# Patient Record
Sex: Female | Born: 1935 | Race: White | Hispanic: No | Marital: Married | Smoking: Never smoker
Health system: Southern US, Community
[De-identification: ages and names within clinical notes are randomized; demographics above are authoritative.]

## PROBLEM LIST (undated history)

## (undated) DIAGNOSIS — J449 Chronic obstructive pulmonary disease, unspecified: Secondary | ICD-10-CM

## (undated) DIAGNOSIS — I10 Essential (primary) hypertension: Secondary | ICD-10-CM

## (undated) DIAGNOSIS — K5792 Diverticulitis of intestine, part unspecified, without perforation or abscess without bleeding: Secondary | ICD-10-CM

## (undated) DIAGNOSIS — K219 Gastro-esophageal reflux disease without esophagitis: Secondary | ICD-10-CM

## (undated) DIAGNOSIS — Z87442 Personal history of urinary calculi: Secondary | ICD-10-CM

## (undated) DIAGNOSIS — I639 Cerebral infarction, unspecified: Secondary | ICD-10-CM

## (undated) DIAGNOSIS — G459 Transient cerebral ischemic attack, unspecified: Secondary | ICD-10-CM

## (undated) DIAGNOSIS — J189 Pneumonia, unspecified organism: Secondary | ICD-10-CM

## (undated) DIAGNOSIS — Z8639 Personal history of other endocrine, nutritional and metabolic disease: Secondary | ICD-10-CM

## (undated) DIAGNOSIS — Z8719 Personal history of other diseases of the digestive system: Secondary | ICD-10-CM

## (undated) HISTORY — PX: WISDOM TOOTH EXTRACTION: SHX21

## (undated) HISTORY — PX: ABDOMINAL HYSTERECTOMY: SHX81

## (undated) HISTORY — PX: CHOLECYSTECTOMY: SHX55

## (undated) HISTORY — PX: COLONOSCOPY: SHX174

## (undated) HISTORY — DX: Diverticulitis of intestine, part unspecified, without perforation or abscess without bleeding: K57.92

## (undated) HISTORY — PX: TONSILLECTOMY: SUR1361

## (undated) HISTORY — PX: UPPER GI ENDOSCOPY: SHX6162

---

## 1998-12-06 ENCOUNTER — Other Ambulatory Visit: Admission: RE | Admit: 1998-12-06 | Discharge: 1998-12-06 | Payer: Self-pay | Admitting: *Deleted

## 2001-08-14 ENCOUNTER — Encounter: Payer: Self-pay | Admitting: Gastroenterology

## 2001-09-14 ENCOUNTER — Encounter: Payer: Self-pay | Admitting: Gastroenterology

## 2002-10-19 ENCOUNTER — Encounter: Payer: Self-pay | Admitting: Gastroenterology

## 2002-10-20 ENCOUNTER — Encounter: Payer: Self-pay | Admitting: Gastroenterology

## 2002-11-22 ENCOUNTER — Encounter: Payer: Self-pay | Admitting: Gastroenterology

## 2004-03-02 ENCOUNTER — Encounter: Payer: Self-pay | Admitting: Gastroenterology

## 2007-02-12 ENCOUNTER — Encounter: Payer: Self-pay | Admitting: Gastroenterology

## 2007-02-24 ENCOUNTER — Encounter: Payer: Self-pay | Admitting: Gastroenterology

## 2007-06-09 ENCOUNTER — Ambulatory Visit: Payer: Self-pay | Admitting: Internal Medicine

## 2007-07-03 ENCOUNTER — Ambulatory Visit: Payer: Self-pay | Admitting: Internal Medicine

## 2007-07-03 ENCOUNTER — Ambulatory Visit: Payer: Self-pay | Admitting: Cardiology

## 2007-07-21 ENCOUNTER — Ambulatory Visit: Payer: Self-pay | Admitting: Internal Medicine

## 2007-08-10 ENCOUNTER — Ambulatory Visit: Payer: Self-pay | Admitting: Internal Medicine

## 2007-08-11 ENCOUNTER — Ambulatory Visit: Payer: Self-pay | Admitting: Cardiology

## 2008-02-29 DIAGNOSIS — J018 Other acute sinusitis: Secondary | ICD-10-CM

## 2008-02-29 DIAGNOSIS — R05 Cough: Secondary | ICD-10-CM

## 2008-03-01 ENCOUNTER — Ambulatory Visit: Payer: Self-pay | Admitting: Pulmonary Disease

## 2008-03-01 ENCOUNTER — Ambulatory Visit: Payer: Self-pay | Admitting: Internal Medicine

## 2008-03-01 DIAGNOSIS — J189 Pneumonia, unspecified organism: Secondary | ICD-10-CM

## 2008-03-16 ENCOUNTER — Ambulatory Visit: Payer: Self-pay | Admitting: Internal Medicine

## 2008-03-25 ENCOUNTER — Ambulatory Visit: Payer: Self-pay | Admitting: Pulmonary Disease

## 2008-03-25 DIAGNOSIS — R0602 Shortness of breath: Secondary | ICD-10-CM | POA: Insufficient documentation

## 2008-03-25 LAB — CONVERTED CEMR LAB
CO2: 30 meq/L (ref 19–32)
Calcium: 9.9 mg/dL (ref 8.4–10.5)
Chloride: 105 meq/L (ref 96–112)
GFR calc Af Amer: 105 mL/min
GFR calc non Af Amer: 87 mL/min
Glucose, Bld: 96 mg/dL (ref 70–99)
Sodium: 142 meq/L (ref 135–145)

## 2008-03-28 ENCOUNTER — Ambulatory Visit: Payer: Self-pay | Admitting: Cardiology

## 2008-05-04 ENCOUNTER — Ambulatory Visit: Payer: Self-pay | Admitting: Internal Medicine

## 2008-05-05 LAB — CONVERTED CEMR LAB
Basophils Relative: 0.1 % (ref 0.0–1.0)
Eosinophils Relative: 1.6 % (ref 0.0–5.0)
HCT: 41 % (ref 36.0–46.0)
Hemoglobin: 14 g/dL (ref 12.0–15.0)
Monocytes Absolute: 0.5 10*3/uL (ref 0.1–1.0)
Monocytes Relative: 9 % (ref 3.0–12.0)
Neutro Abs: 3 10*3/uL (ref 1.4–7.7)
RBC: 4.92 M/uL (ref 3.87–5.11)
RDW: 14.4 % (ref 11.5–14.6)
Sed Rate: 10 mm/hr (ref 0–22)

## 2008-10-01 IMAGING — CR DG CHEST 2V
2 series · 2 of 2 positions shown · non-contrast
Comparison: 03/01/2008.

CLINICAL DATA: Chronic cough and dyspnea.  Increased cough for 1
week.

CHEST - 2 VIEW

[view not recorded (1 of 2)]
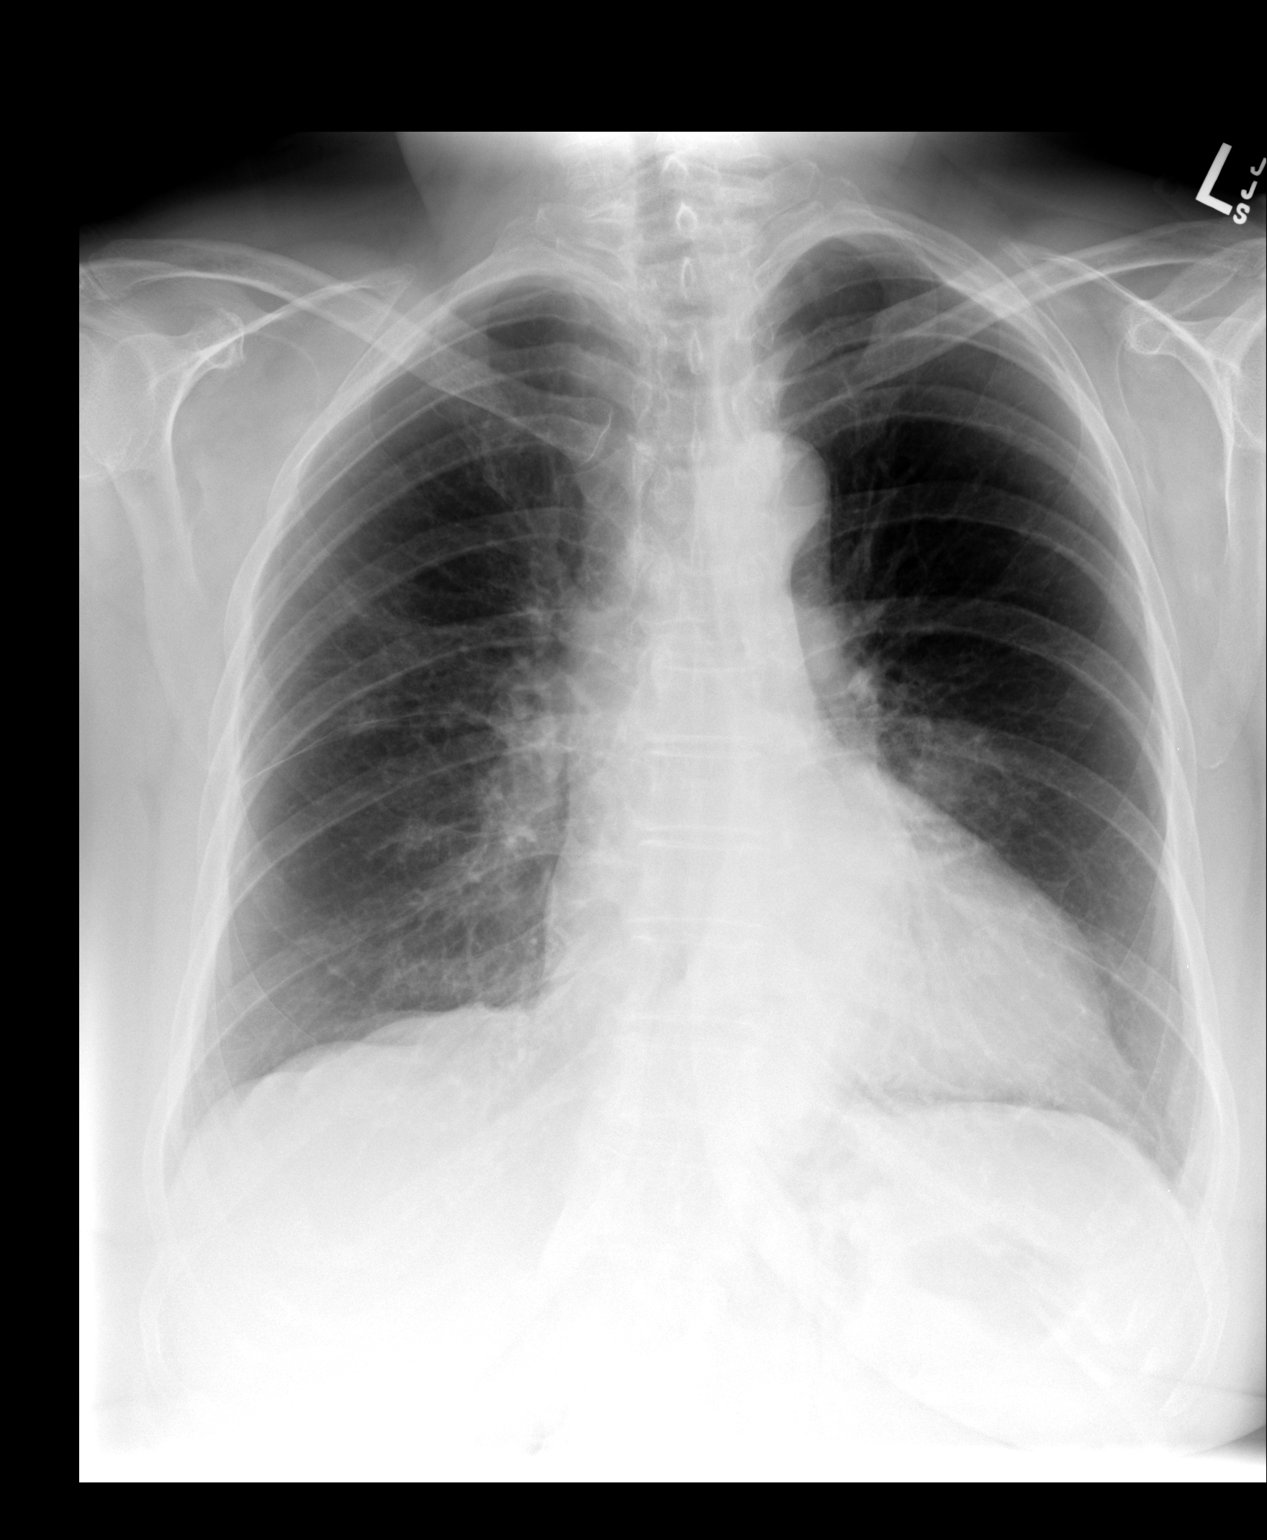

[view not recorded (2 of 2)]
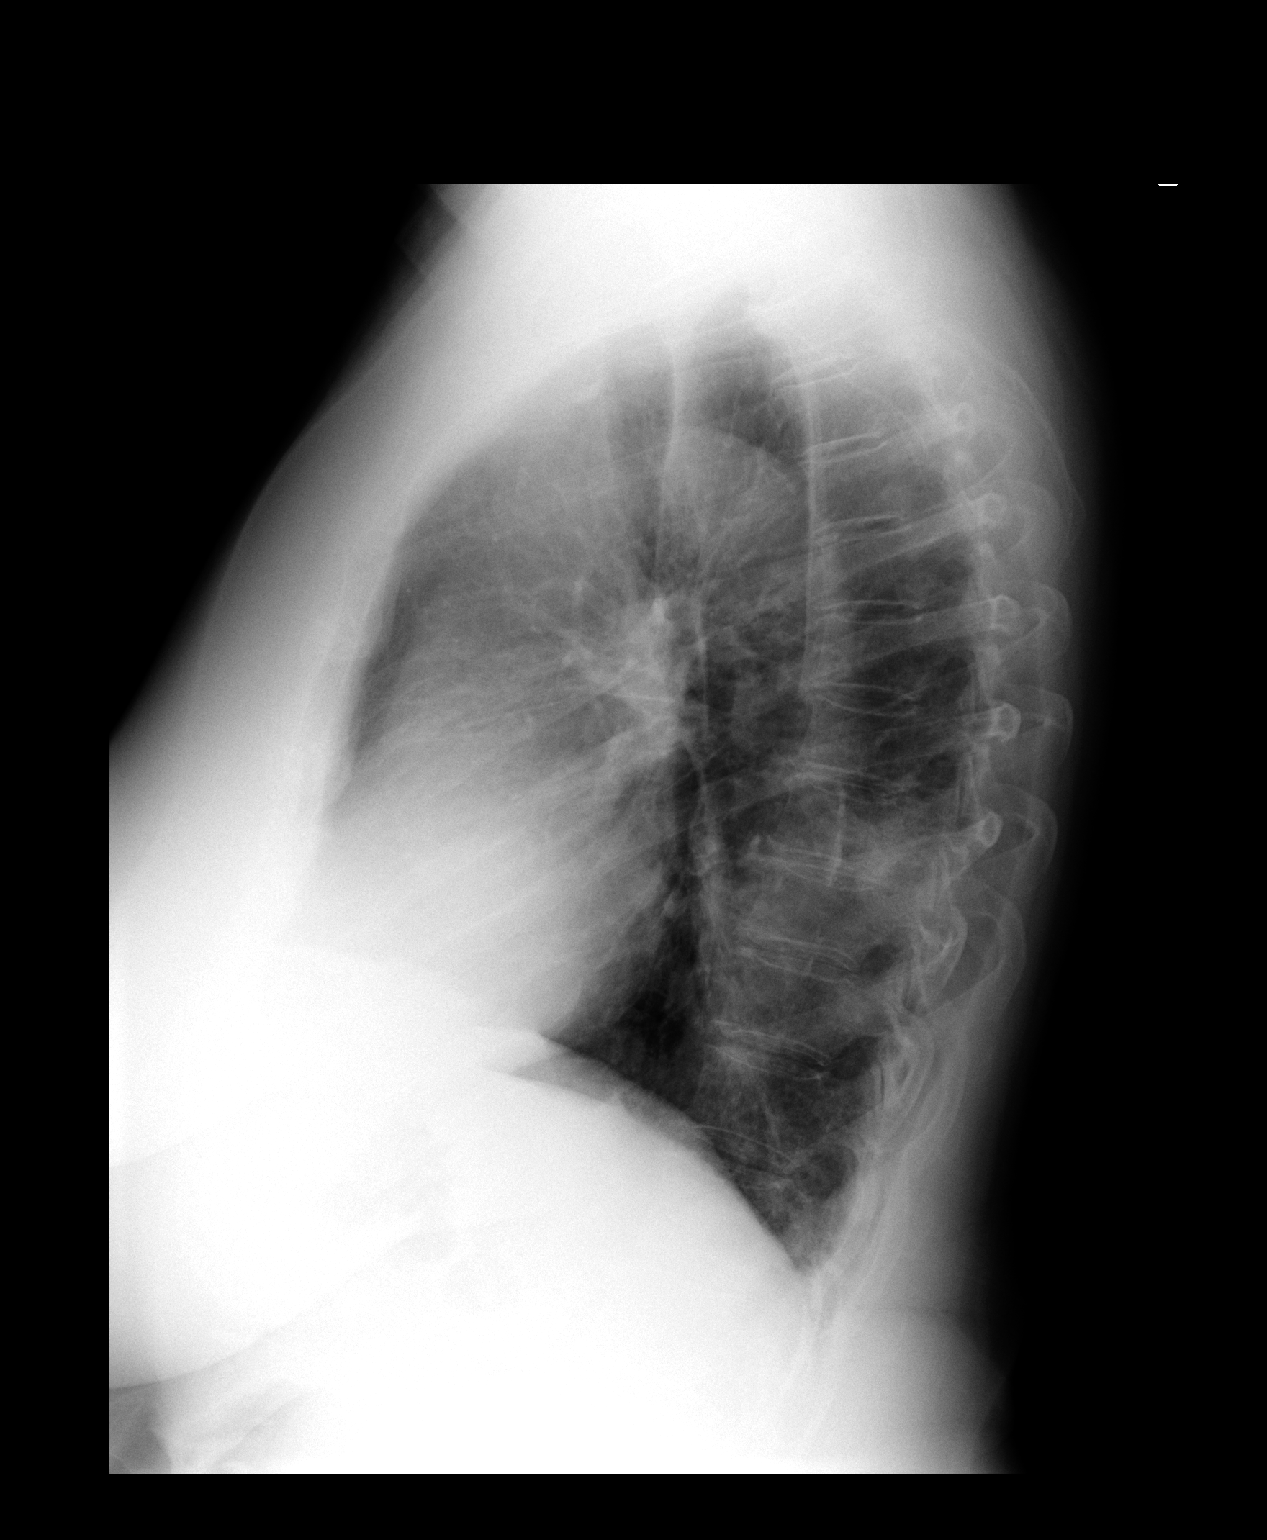

[2 of 2 positions shown; findings below may reference images not displayed]

FINDINGS: There is stable mild cardiac enlargement.  Aortic
tortuosity appears stable.  There is new air space disease in the
superior segment of the left lower lobe.  There is also a possible
patchy opacity in the right upper lobe, best seen on the frontal
examination.  No pleural effusion is seen.
IMPRESSION: Left lower lobe, and possible right upper lobe, pneumonia.
Radiographic follow up to document clearing is recommended.

I discussed the findings by telephone with Dr Hsibullah on 03/25/2008
at 2258 hours.

## 2008-10-28 ENCOUNTER — Ambulatory Visit: Payer: Self-pay | Admitting: Internal Medicine

## 2008-10-28 DIAGNOSIS — K219 Gastro-esophageal reflux disease without esophagitis: Secondary | ICD-10-CM | POA: Insufficient documentation

## 2008-11-23 ENCOUNTER — Telehealth (INDEPENDENT_AMBULATORY_CARE_PROVIDER_SITE_OTHER): Payer: Self-pay | Admitting: *Deleted

## 2008-12-16 DIAGNOSIS — E785 Hyperlipidemia, unspecified: Secondary | ICD-10-CM

## 2008-12-16 DIAGNOSIS — K5730 Diverticulosis of large intestine without perforation or abscess without bleeding: Secondary | ICD-10-CM | POA: Insufficient documentation

## 2008-12-16 DIAGNOSIS — M810 Age-related osteoporosis without current pathological fracture: Secondary | ICD-10-CM | POA: Insufficient documentation

## 2008-12-16 DIAGNOSIS — F411 Generalized anxiety disorder: Secondary | ICD-10-CM | POA: Insufficient documentation

## 2008-12-16 DIAGNOSIS — I10 Essential (primary) hypertension: Secondary | ICD-10-CM | POA: Insufficient documentation

## 2008-12-21 ENCOUNTER — Ambulatory Visit: Payer: Self-pay | Admitting: Gastroenterology

## 2008-12-21 DIAGNOSIS — R198 Other specified symptoms and signs involving the digestive system and abdomen: Secondary | ICD-10-CM | POA: Insufficient documentation

## 2008-12-28 ENCOUNTER — Ambulatory Visit: Payer: Self-pay | Admitting: Gastroenterology

## 2008-12-28 ENCOUNTER — Telehealth: Payer: Self-pay | Admitting: Gastroenterology

## 2008-12-28 ENCOUNTER — Ambulatory Visit (HOSPITAL_COMMUNITY): Admission: RE | Admit: 2008-12-28 | Discharge: 2008-12-28 | Payer: Self-pay | Admitting: Gastroenterology

## 2008-12-30 ENCOUNTER — Encounter: Payer: Self-pay | Admitting: Gastroenterology

## 2008-12-30 ENCOUNTER — Telehealth: Payer: Self-pay | Admitting: Gastroenterology

## 2009-01-04 ENCOUNTER — Telehealth: Payer: Self-pay | Admitting: Gastroenterology

## 2009-01-04 ENCOUNTER — Encounter: Payer: Self-pay | Admitting: Gastroenterology

## 2009-01-04 ENCOUNTER — Ambulatory Visit (HOSPITAL_COMMUNITY): Admission: RE | Admit: 2009-01-04 | Discharge: 2009-01-04 | Payer: Self-pay | Admitting: Gastroenterology

## 2009-01-04 ENCOUNTER — Ambulatory Visit: Payer: Self-pay | Admitting: Gastroenterology

## 2009-01-04 DIAGNOSIS — R197 Diarrhea, unspecified: Secondary | ICD-10-CM

## 2009-01-04 LAB — CONVERTED CEMR LAB
AST: 16 units/L (ref 0–37)
Albumin: 3.8 g/dL (ref 3.5–5.2)
Alkaline Phosphatase: 81 units/L (ref 39–117)
CRP, High Sensitivity: 15 — ABNORMAL HIGH (ref 0.00–5.00)
Eosinophils Absolute: 0 10*3/uL (ref 0.0–0.7)
HCT: 43.5 % (ref 36.0–46.0)
MCHC: 35 g/dL (ref 30.0–36.0)
MCV: 82.8 fL (ref 78.0–100.0)
Monocytes Absolute: 0.6 10*3/uL (ref 0.1–1.0)
Neutro Abs: 4.3 10*3/uL (ref 1.4–7.7)
Platelets: 342 10*3/uL (ref 150–400)
Potassium: 3.5 meq/L (ref 3.5–5.1)
RDW: 13.4 % (ref 11.5–14.6)
Sodium: 141 meq/L (ref 135–145)
Total Bilirubin: 0.7 mg/dL (ref 0.3–1.2)
Total Protein: 7.3 g/dL (ref 6.0–8.3)

## 2009-01-06 ENCOUNTER — Telehealth: Payer: Self-pay | Admitting: Gastroenterology

## 2009-01-06 ENCOUNTER — Encounter: Payer: Self-pay | Admitting: Gastroenterology

## 2009-01-12 ENCOUNTER — Ambulatory Visit: Payer: Self-pay | Admitting: Gastroenterology

## 2009-01-12 DIAGNOSIS — K5289 Other specified noninfective gastroenteritis and colitis: Secondary | ICD-10-CM

## 2009-01-16 ENCOUNTER — Telehealth: Payer: Self-pay | Admitting: Gastroenterology

## 2009-01-17 ENCOUNTER — Telehealth: Payer: Self-pay | Admitting: Gastroenterology

## 2009-02-15 ENCOUNTER — Ambulatory Visit: Payer: Self-pay | Admitting: Gastroenterology

## 2009-02-28 ENCOUNTER — Telehealth: Payer: Self-pay | Admitting: Gastroenterology

## 2009-03-03 ENCOUNTER — Ambulatory Visit: Payer: Self-pay | Admitting: Gastroenterology

## 2009-03-03 DIAGNOSIS — R197 Diarrhea, unspecified: Secondary | ICD-10-CM | POA: Insufficient documentation

## 2009-03-03 DIAGNOSIS — K625 Hemorrhage of anus and rectum: Secondary | ICD-10-CM

## 2009-03-03 DIAGNOSIS — R1084 Generalized abdominal pain: Secondary | ICD-10-CM

## 2009-03-03 LAB — CONVERTED CEMR LAB
Basophils Absolute: 0 10*3/uL (ref 0.0–0.1)
Basophils Relative: 0.5 % (ref 0.0–3.0)
HCT: 37.4 % (ref 36.0–46.0)
Hemoglobin: 12.5 g/dL (ref 12.0–15.0)
Lymphocytes Relative: 39.8 % (ref 12.0–46.0)
Lymphs Abs: 3.1 10*3/uL (ref 0.7–4.0)
MCHC: 33.4 g/dL (ref 30.0–36.0)
Monocytes Relative: 7.3 % (ref 3.0–12.0)
Neutro Abs: 3.9 10*3/uL (ref 1.4–7.7)
RBC: 4.46 M/uL (ref 3.87–5.11)
RDW: 14.2 % (ref 11.5–14.6)

## 2009-03-06 ENCOUNTER — Encounter: Payer: Self-pay | Admitting: Physician Assistant

## 2009-04-19 ENCOUNTER — Telehealth (INDEPENDENT_AMBULATORY_CARE_PROVIDER_SITE_OTHER): Payer: Self-pay | Admitting: *Deleted

## 2009-05-18 ENCOUNTER — Telehealth: Payer: Self-pay | Admitting: Gastroenterology

## 2010-05-28 ENCOUNTER — Telehealth: Payer: Self-pay | Admitting: Gastroenterology

## 2010-06-26 ENCOUNTER — Telehealth: Payer: Self-pay | Admitting: Gastroenterology

## 2010-06-27 ENCOUNTER — Encounter: Payer: Self-pay | Admitting: Gastroenterology

## 2010-06-28 ENCOUNTER — Encounter: Payer: Self-pay | Admitting: Gastroenterology

## 2010-12-09 ENCOUNTER — Encounter: Payer: Self-pay | Admitting: Gastroenterology

## 2010-12-20 NOTE — Progress Notes (Signed)
Summary: Medication  Phone Note Call from Patient Call back at Home Phone [phone]   Caller: Patient Call For: Dr. Arlyce Dice Reason for Call: Talk to Nurse Summary of Call: Ins. will not cover KAPIDEX....is there an alternative or can Dr. Arlyce Dice write a letter to Ins. Initial call taken by: Karna Christmas,  May 28, 2010 11:30 AM  Follow-up for Phone Call        Pt states that she can not afford dexilant, It cost her 140$ for her monthly prescription. She stated this med is the only PPI that helps her. Told her I could give her samples until I could get the med approved through her insurance. Follow-up by: Merri Ray CMA Duncan Dull),  May 28, 2010 1:02 PM  Additional Follow-up for Phone Call Additional follow up Details #1::        Resent med, Have not heard back from pharmacy or pt. Additional Follow-up by: Merri Ray CMA (AAMA),  May 31, 2010 1:56 PM    New/Updated Medications: DEXILANT 60 MG CPDR (DEXLANSOPRAZOLE) 1 by mouth once daily Prescriptions: DEXILANT 60 MG CPDR (DEXLANSOPRAZOLE) 1 by mouth once daily  #30 x 11   Entered by:   Merri Ray CMA (AAMA)   Authorized by:   Louis Meckel MD   Signed by:   Merri Ray CMA (AAMA) on 05/28/2010   Method used:   Electronically to        [facility] (retail)       [address]       [place], [place]  [address]       Ph: [phone] or [phone]       Fax: [phone]   RxID:   [id number]

## 2010-12-20 NOTE — Medication Information (Signed)
Summary: Dexilant Approved/[employer]  Dexilant Approved/[employer]   Imported By: Sherian Rein 07/02/2010 12:13:35  _____________________________________________________________________  External Attachment:    Type:   Image     Comment:   External Document

## 2010-12-20 NOTE — Progress Notes (Signed)
Summary: Medication Refill  Phone Note Call from Patient Call back at Home Phone [phone] Call back at [phone]   Caller: Patient Call For: Dr. Arlyce Dice Reason for Call: Talk to Doctor Details for Reason: Medication Refill Summary of Call: Pt. needs Kapidex. Has 2 pills left. States our office needs to call and verify that she "needs" them so she can get them for a lesser price. Call pt. if you have any questions. Initial call taken by: Schuyler Amor,  June 26, 2010 9:40 AM  Follow-up for Phone Call        Called pt to inform that new prescription of Dexilant was faxed in on 7/14. She stated the insurance co had to be contacted. Explained to her that we have to have a request fornm from pharmacy with prescrition information on it to be able to contact her insurance company. Offered pt samples today.  Follow-up by: Merri Ray CMA Duncan Dull),  June 26, 2010 10:18 AM     Appended Document: Medication Refill Pt approved for Dexilant  06/28/2010-06/29/2011  approval sent down to be scanned

## 2010-12-20 NOTE — Medication Information (Signed)
Summary: Prior [employer] for Dexilant/[employer]  Prior [employer] for Dexilant/[employer]   Imported By: Sherian Rein 07/16/2010 08:44:18  _____________________________________________________________________  External Attachment:    Type:   Image     Comment:   External Document

## 2011-04-02 NOTE — Assessment & Plan Note (Signed)
[facility]                             PULMONARY OFFICE NOTE   Melissa, Herring                        MRN:          [id number]  DATE:08/10/2007                            DOB:          Apr 27, 1931    HISTORY:  This is a 77 year old white female returned all smiles with  a diagnosis of chronic cough felt to be secondary to sinus disease with  or without reflux.  She underwent a sinus CT scan dated August 15 that  showed pansinusitis, and seemed 100% better while on a prolonged  course of antibiotics, but now is beginning to have slight increase in  throat clearing, having been off the antibiotics for over a week, while  still maintaining on high doses of acid suppression in the form of  Prilosec plus nightly Zantac plus metoclopramide.   PHYSICAL EXAMINATION:  She does, indeed, clear her throat frequently.  She is afebrile with normal vital signs.  HEENT:  Unremarkable.  Oropharynx clear.  Lung fields are perfectly clear bilaterally to auscultation and  percussion.  HEART:  Regular rate and rhythm without murmurs, gallops, or rubs.  ABDOMEN:  Soft, benign.  EXTREMITIES:  Warm without calf tenderness, cyanosis, clubbing, or  edema.   Pulmonary function tests were reviewed with the patient from August 15,  and showed no evidence of significant airflow obstruction.   IMPRESSION:  Chronic cough is upper airway in nature and probably is  related to sinusitis with secondary gastroesophageal reflux disease from  coughing.  This is why she responds, to some extent, for treatment  directed to either rhinitis or reflux, but never completely better  unless both are treated aggressively.   Unfortunately, given the severity of her sinus disease on CT scan, it is  likely that even a prolonged course of antibiotics did not correct the  problem.  Nonetheless, I did try to educate her on optimal management of  chronic rhinitis, including introducing her to  the use of Nasonex today,  and asking her to repeat a full coronal CT scan to see if ENT referral  will be necessary for long term followup.   Until we sort out, however, the causes of cough, I have asked her to  continue on her present regimen directed at reflux, and have added the  Nasonex for rhinitis today.  Will await the results of a CT scan before  considering referral to ENT versus back here for possible allergy  testing for sinus disease, depending on the results of the CT scan that  has been ordered for September 23.    [name]. [name], MD, [facility]  Electronically Signed   MBW/MedQ  DD: 08/10/2007  DT: 08/11/2007  Job #: [id number]   cc:   Feliciana Rossetti, MD

## 2011-04-02 NOTE — Assessment & Plan Note (Signed)
[facility]                             PULMONARY OFFICE NOTE   RAMATOULAYE, PACK                        MRN:          [id number]  DATE:07/21/2007                            DOB:          10-08-1931    HISTORY AND PHYSICAL:  Patient is a 77 year old white female patient of  Dr. Thurston Hole who has a history of a progressively worsening cough and  dyspnea, dating back to January, 2008.  She was recently seen two weeks  ago and underwent a CT of the sinuses, which showed extensive sinusitis  throughout the paranasal sinuses.  The patient was given a total 15 days  of Avelox.  Patient was also felt to have upper airway cough syndrome,  possibly secondary to gastroesophageal reflux.  The patient was given  aggressive reflux preventative regimen with Nexium or Prilosec twice  daily along with Reglan 10 mg four times a day and Zantac 2 at bedtime.  She was recommended to use Mucinex DM twice daily, tramadol for  breakthrough coughing, and a prednisone taper.   Patient returns back today reporting that she has substantially improved  and feels that she is 90% better with only an occasional cough.  Patient  has bought all of her medications in today for review.  Patient does  have four additional days of Avelox left; however, upon calculation,  should have finished her prescription on July 18, 2007.   Patient also did not bring her Prilosec, Mucinex DM, or tramadol;  however, she assures me that she has them at home and has been using  them.   Patient denies any purulent sputum, fever, chest pain, orthopnea, PND,  or leg swelling.   PAST MEDICAL HISTORY:  Reviewed.   CURRENT MEDICATIONS:  Reviewed.   PHYSICAL EXAMINATION:  Patient is a pleasant female in no acute  distress.  She is afebrile.  Blood pressure 164/84, O2 saturation is 95% on room  air.  HEENT:  Nasal mucosa with some mild erythema.  Nontender sinuses.  The  posterior pharynx is clear.  NECK:  Supple without cervical adenopathy.  No JVD.  LUNGS:  Lung sounds are clear to auscultation bilaterally without any  wheezing or crackles.  CARDIAC:  Regular rate.  ABDOMEN:  Soft and nontender.  EXTREMITIES:  Warm without any edema.   IMPRESSION/PLAN:  1. Acute sinusitis by CT scan on July 03, 2007.  Patient is to      finish Avelox, as recommended, for a total of 15 days.  Is to use      Mucinex DM and tramadol as needed for cough control.  Patient is to      return back here in 2-3 weeks with Dr. [name] or sooner if needed.  2. Upper airway cough syndrome, possibly secondary to gastroesophageal      reflux and/or underlying sinusitis.  Patient is to continue on her      above regimen for underlying sinus infection and continue on her      aggressive reflux preventative measures, along with Mucinex DM and      tramadol  as needed for breakthrough coughing.  Patient will return      back here in 2-3 weeks or sooner if needed.  3. Complex medication regimen:  Patient's medications are reviewed in      detail.  Patient education is provided.  Patient was given a      computerized medication calendar, which was reviewed in detail.      Rubye Oaks, NP  Electronically Signed      [name]. [name], MD, [facility]  Electronically Signed   TP/MedQ  DD: 07/21/2007  DT: 07/21/2007  Job #: [id number]

## 2011-04-02 NOTE — Assessment & Plan Note (Signed)
[facility]                             PULMONARY OFFICE NOTE   Melissa Herring, Melissa Herring                        MRN:          [id number]  DATE:06/09/2007                            DOB:          08-Jul-1931    PULMONARY CONSULTATION:   CHIEF COMPLAINT:  Cough and dyspnea.   HISTORY:  This is a very nice 77 year old female, never smoker, who saw  Dr. Jayme Cloud in 2005 for unexplained dyspnea and was felt to have  LPR/GERD.  Apparently, she had a longstanding diagnosis of GERD prior to  developing atypical respiratory symptoms with a normal set of PFTs and  atypical chest pain leading to this diagnosis.  It was recommended that  she maintain Nexium monotherapy then, and she did well for several years  on Nexium without any chronic complaints but then in December underwent  cholecystectomy, and her breathing has been downhill ever since.  She  says she has not had a good day since January with dyspnea with minimal  activity and was acutely hospitalized in June with a left lower lobe  pneumonia, for which she was treated with an empiric course of  antibiotics and improved but no where near the baseline she had prior to  her original operation.  She denies any ongoing dysphagia, fevers,  chills, sweats, purulent sputum, orthopnea, PND, leg swelling, or chest  pain of any kind.   PAST MEDICAL HISTORY:  Significant for hypertension, for which she is  not on ACE inhibitor, and osteoporosis, for which she is on Fosamax.   ALLERGIES:  PENICILLIN causes a rash.   MEDICATIONS:  Nexium, Fosamax, Astelin, Symbicort, and Nasonex as well  as Proventil p.r.n.  She does not think her inhalers are helping.   SOCIAL HISTORY:  She has never smoked.  She was exposed growing up, her  father was a smoker.   FAMILY HISTORY:  Positive for cancer in her daughter, of the rectum.  Negative for respiratory disease.   REVIEW OF SYSTEMS:  Taken in detail on the work sheet and  essentially  negative except as outlined above.   PHYSICAL EXAMINATION:  This is an anxious white female with classic  voice fatigue and hoarseness.  She is afebrile with normal vital signs.  HEENT:  Unremarkable.  Her oropharynx is clear.  Nasal turbinates are  normal.  NECK:  Supple without cervical adenopathy or tenderness.  Trachea is  midline.  No thyromegaly.  LUNGS:  Perfectly clear bilaterally to auscultation and percussion  except for a prominent pseudowheeze.  HEART:  Regular rhythm without murmur, rub or gallop.  ABDOMEN:  Soft, benign with no palpable organomegaly.  EXTREMITIES:  Warm without calf tenderness, clubbing, cyanosis or edema.   IMPRESSION:  Classic pseudoasthma, first suggested by Dr. Jayme Cloud in  2005 and now with symptoms of refractory coughing, wheezing, and dyspnea  that did not respond to appropriate treatment directed at asthma/chronic  obstructive pulmonary disease.  Clearly, chronic obstructive pulmonary  disease does not fit because she had normal PFTs in 2005 and has only  had passive smoking exposure.  Rather  than reinvent the wheel, I am  going to go back to the original evaluation that we did in 2005 and  restart high doses of Nexium 40 mg b.i.d. before meals, reviewed a diet  with her which avoids menthol lozenges, and eliminate Fosamax from her  regimen for a trial period of up to six weeks.  If she coughs during  this time, she can use Delsym supplemented with tramadol.  If she is  wheezing or short of breath, she can use Proventil 2 puffs every 4 hours  p.r.n. but both of these should not be necessary within a couple of  weeks of suppressing acid aggressively.  The other option would be to  add on Reglan if she fails to respond to acid suppression, because many  patients have nonacid reflux.   The connection with gallbladder surgery is interesting anecdotally in  that we have had many patients who have upper airway syndromes who  exacerbate  following endotracheal tube placement.  This is probably  because the epithelial lining that protects to some extent the upper  airway from the effects of acid is denuded by the endotracheal tube.  Therefore, it may well be once the upper airway is healed that we can  rechallenge her with Fosamax or some other biphosphonate (perhaps a  monthly biphosphonate of Dr. Anders Grant choosing).   Also, paradoxically, many of the inhalers actually exacerbate this  problem.  I am going to ask her to stop all maintenance inhalers in  favor of just p.r.n. Proventil, which I spent extra time showing her how  to use.  Finally, she was concerned about a CT scan, which I have  reviewed with her from July 15 that shows marked improvement in aeration  in the left lung with minimal residual scarring, for which radiology  recommended a CT scan at three months.  Will do a baseline chest x-ray  and PFTs at six weeks and see how it compares to previous studies we  have available here in the office from her previous evaluation.   All of these instructions were reviewed with the patient in writing as  well as a reflux diet.     Melissa Herring. [name], MD, [facility]  Electronically Signed    MBW/MedQ  DD: 06/09/2007  DT: 06/10/2007  Job #: [id number]   cc:   Feliciana Rossetti, MD

## 2011-04-02 NOTE — Assessment & Plan Note (Signed)
[facility]                             PULMONARY OFFICE NOTE   Melissa Herring, Melissa Herring                        MRN:          [id number]  DATE:07/03/2007                            DOB:          [birth year]    HISTORY:  A 77 year old white female, never smoker with worsening  symptoms of dyspnea and cough dating back to January 2008, with no  improvement in dyspnea with exertion nor hacking dry cough since I saw  her on July 22, and recommended that she eliminate all of her inhalers  except for p.r.n. albuterol for shortness of breath and wheeze.  She  misunderstood a portion of the instructions and has been trying to use  the Proventil for cough whereas the cough instructions say to take  Delsym supplemented with Tramadol.  She has not yet picked up the Delsym  and has not used much of the Tramadol.  She has a constant sensation of  something draining in the back of her throat associated with chest  congestion  but actually produces no purulent sputum.  The cough is  worse on lying down at night.  However, once she settles down it does  not disturb her sleep.  She denies any pleuritic pain, fevers, chills,  sweats, orthopnea, PND, or leg swelling.   MEDICATIONS:  At this point consist only of:  1. Nasonex 2 puffs at bedtime.  2. Nexium 40 mg b.i.d.  3. Proventil p.r.n.   PHYSICAL EXAMINATION:  GENERAL:  She is an anxious white female who  clears her throat frequently during the exam and has somewhat of a  rattling upper airway type cough.  VITAL SIGNS:  She is afebrile with normal vital signs.  HEENT:  Remarkable for minimal turbinate edema.  Oropharynx is clear.  There is no evidence of excessive postnasal drainage or cobblestoning.  NECK:  Supple without cervical adenopathy or tenderness.  Trachea is  midline.  No thyromegaly.  LUNGS:  Fields reveal minimum pseudo wheeze bilaterally.  HEART:  There is a regular rhythm without murmur, gallop, or  rub.  ABDOMEN:  Soft, benign.  EXTREMITIES:  Warm without calf tenderness, cyanosis, clubbing, edema.   PFTs performed today are essentially normal without the patient taking  her albuterol today.   IMPRESSION:  1. No evidence of asthma here.  2. She has an upper airway cough syndrome that is either      gastroesophageal reflux disease or rhinitis related or functional      in nature.  It is impossible to distinguish these at this point.   So, I am going to recommend:  1. Proceeding with a sinus CT scan to exclude sinusitis and push hard      on optimal treatment for reflux as follows:  2. Continue to take Nexium 40 mg tablets, 30-60 minutes before      breakfast and before supper while supplementing with metoclopramide      10 mg before meals and at bedtime.  3. Continue off bisphosphonates for now.  4. Add Zantac 75 mg two q.h.s.  5. If coughing  use Mucinex DM 2 b.i.d. and add Tramadol 50 mg one      every four hours p.r.n.  6. If nose stuffy, add Afrin 2 puffs before Nasonex dosing.  7. If wheezing or short of breath, use Proventil 2 puffs every four      hours.  8. I am going to give her a 10-day course of prednisone.  9. Bring her back in 2 weeks for a full medication reconciliation and      generation of medication calendar, noting that this patient is      struggling a bit with a complex medical regimen but after almost 30      minutes of discussion with her and her husband today, I believe she      gets the picture better than on the first evaluation.     [name]. [name], MD, [facility]  Electronically Signed    MBW/MedQ  DD: 07/03/2007  DT: 07/03/2007  Job #: [id number]   cc:   Feliciana Rossetti, MD

## 2011-06-06 ENCOUNTER — Other Ambulatory Visit: Payer: Self-pay | Admitting: Gastroenterology

## 2012-01-24 ENCOUNTER — Encounter: Payer: Self-pay | Admitting: Gastroenterology

## 2012-02-12 ENCOUNTER — Ambulatory Visit (INDEPENDENT_AMBULATORY_CARE_PROVIDER_SITE_OTHER): Payer: Medicare Other | Admitting: Gastroenterology

## 2012-02-12 ENCOUNTER — Encounter: Payer: Self-pay | Admitting: Gastroenterology

## 2012-02-12 VITALS — BP 164/82 | HR 60 | Ht 62.0 in | Wt 147.8 lb

## 2012-02-12 DIAGNOSIS — R1310 Dysphagia, unspecified: Secondary | ICD-10-CM | POA: Insufficient documentation

## 2012-02-12 DIAGNOSIS — K219 Gastro-esophageal reflux disease without esophagitis: Secondary | ICD-10-CM

## 2012-02-12 DIAGNOSIS — R1084 Generalized abdominal pain: Secondary | ICD-10-CM

## 2012-02-12 DIAGNOSIS — Z800 Family history of malignant neoplasm of digestive organs: Secondary | ICD-10-CM

## 2012-02-12 MED ORDER — NA SULFATE-K SULFATE-MG SULF 17.5-3.13-1.6 GM/177ML PO SOLN: 2.0000 | ORAL | Status: DC

## 2012-02-12 NOTE — Progress Notes (Signed)
History of Present Illness:  Melissa Herring is a 78 year old white female here for evaluation of abdominal pain and bloating. For several months she's been complaining of diffuse postprandial abdominal distention with discomfort in her lower abdomen. She denies change of bowel habits, melena or hematochezia. She also is complaining of frequent regurgitation of gastric contents when lying supine and now sleeps in a recliner. She takes dexilant every morning. She also complains of dysphagia to solids. Colonoscopy in 2010  showed a diffuse colitis in the proximal colon. This was done because of diarrhea. Biopsies demonstrated a non-specific colitis. She was treated with lialda and eventually prednisone with resolution of her symptoms.  In 2003 a distal esophageal mucosal ring was dilated.   History reviewed. No pertinent past medical history. Past Surgical History  Procedure Date  . Abdominal hysterectomy   . Tonsillectomy   . Cholecystectomy    family history includes Breast cancer in her mother; Colon cancer in her daughter; and Diverticulosis in her father. Current Outpatient Prescriptions  Medication Sig Dispense Refill  . lisinopril (PRINIVIL,ZESTRIL) 20 MG tablet Take 20 mg by mouth daily.      Marland Kitchen oxybutynin (DITROPAN-XL) 10 MG 24 hr tablet Take 10 mg by mouth daily.      Marland Kitchen DEXILANT 60 MG capsule TAKE ONE CAPSULE BY MOUTH EVERY DAY  30 capsule  6   Allergies as of 02/12/2012 - Review Complete 02/12/2012  Allergen Reaction Noted  . Penicillins    . Sulfa antibiotics  02/12/2012    reports that she has never smoked. She has never used smokeless tobacco. She reports that she does not drink alcohol or use illicit drugs.     Review of Systems: Pertinent positive and negative review of systems were noted in the above HPI section. All other review of systems were otherwise negative.  Vital signs were reviewed in today's medical record Physical Exam: General: Well developed , well nourished, no  acute distress Head: Normocephalic and atraumatic Eyes:  sclerae anicteric, EOMI Ears: Normal auditory acuity Mouth: No deformity or lesions Neck: Supple, no masses or thyromegaly Lungs: Clear throughout to auscultation Heart: Regular rate and rhythm; no murmurs, rubs or bruits Abdomen: Abdomen is slightly distended. There is mild diffuse tenderness without guarding or rebound. No masses, hepatosplenomegaly or hernias noted. Normal Bowel sounds Rectal:deferred Musculoskeletal: Symmetrical with no gross deformities  Skin: No lesions on visible extremities Pulses:  Normal pulses noted Extremities: No clubbing, cyanosis, edema or deformities noted Neurological: Alert oriented x 4, grossly nonfocal Cervical Nodes:  No significant cervical adenopathy Inguinal Nodes: No significant inguinal adenopathy Psychological:  Alert and cooperative. Normal mood and affect

## 2012-02-12 NOTE — Assessment & Plan Note (Signed)
Diffuse lower abdominal pain with distention could be reflective of recurrent colitis. Alternatively, symptoms could be nonspecific including ulcer or nonulcer dyspepsia.  Medications #1 colonoscopy

## 2012-02-12 NOTE — Assessment & Plan Note (Signed)
Rule out recurrent esophageal stricture  Recommendations #1 upper endoscopy with dilatation as indicated 

## 2012-02-12 NOTE — Patient Instructions (Signed)
Colonoscopy A colonoscopy is an exam to evaluate your entire colon. In this exam, your colon is cleansed. A long fiberoptic tube is inserted through your rectum and into your colon. The fiberoptic scope (endoscope) is a long bundle of enclosed and very flexible fibers. These fibers transmit light to the area examined and send images from that area to your caregiver. Discomfort is usually minimal. You may be given a drug to help you sleep (sedative) during or prior to the procedure. This exam helps to detect lumps (tumors), polyps, inflammation, and areas of bleeding. Your caregiver may also take a small piece of tissue (biopsy) that will be examined under a microscope. LET YOUR CAREGIVER KNOW ABOUT:   Allergies to food or medicine.   Medicines taken, including vitamins, herbs, eyedrops, over-the-counter medicines, and creams.   Use of steroids (by mouth or creams).   Previous problems with anesthetics or numbing medicines.   History of bleeding problems or blood clots.   Previous surgery.   Other health problems, including diabetes and kidney problems.   Possibility of pregnancy, if this applies.  BEFORE THE PROCEDURE   A clear liquid diet may be required for 2 days before the exam.   Ask your caregiver about changing or stopping your regular medications.   Liquid injections (enemas) or laxatives may be required.   A large amount of electrolyte solution may be given to you to drink over a short period of time. This solution is used to clean out your colon.   You should be present 60 minutes prior to your procedure or as directed by your caregiver.  AFTER THE PROCEDURE   If you received a sedative or pain relieving medication, you will need to arrange for someone to drive you home.   Occasionally, there is a little blood passed with the first bowel movement. Do not be concerned.  FINDING OUT THE RESULTS OF YOUR TEST Not all test results are available during your visit. If your test  results are not back during the visit, make an appointment with your caregiver to find out the results. Do not assume everything is normal if you have not heard from your caregiver or the medical facility. It is important for you to follow up on all of your test results. HOME CARE INSTRUCTIONS   It is not unusual to pass moderate amounts of gas and experience mild abdominal cramping following the procedure. This is due to air being used to inflate your colon during the exam. Walking or a warm pack on your belly (abdomen) may help.   You may resume all normal meals and activities after sedatives and medicines have worn off.   Only take over-the-counter or prescription medicines for pain, discomfort, or fever as directed by your caregiver. Do not use aspirin or blood thinners if a biopsy was taken. Consult your caregiver for medicine usage if biopsies were taken.  SEEK IMMEDIATE MEDICAL CARE IF:   You have a fever.   You pass large blood clots or fill a toilet with blood following the procedure. This may also occur 10 to 14 days following the procedure. This is more likely if a biopsy was taken.   You develop abdominal pain that keeps getting worse and cannot be relieved with medicine.  Document Released: 11/01/2000 Document Revised: 10/24/2011 Document Reviewed: 06/16/2008 ExitCare Patient Information 2012 [employer].  Upper GI Endoscopy Upper GI endoscopy means using a flexible scope to look at the esophagus, stomach, and upper small bowel. This is   done to make a diagnosis in people with heartburn, abdominal pain, or abnormal bleeding. Sometimes an endoscope is needed to remove foreign bodies or food that become stuck in the esophagus; it can also be used to take biopsy samples. For the best results, do not eat or drink for 8 hours before having your upper endoscopy.  To perform the endoscopy, you will probably be sedated and your throat will be numbed with a special spray. The endoscope is  then slowly passed down your throat (this will not interfere with your breathing). An endoscopy exam takes 15 to 30 minutes to complete and there is no real pain. Patients rarely remember much about the procedure. The results of the test may take several days if a biopsy or other test is taken.  You may have a sore throat after an endoscopy exam. Serious complications are very rare. Stick to liquids and soft foods until your pain is better. Do not drive a car or operate any dangerous equipment for at least 24 hours after being sedated. SEEK IMMEDIATE MEDICAL CARE IF:   You have severe throat pain.   You have shortness of breath.   You have bleeding problems.   You have a fever.   You have difficulty recovering from your sedation.  Document Released: 12/12/2004 Document Revised: 10/24/2011 Document Reviewed: 11/06/2008 ExitCare Patient Information 2012 [employer]. 

## 2012-02-12 NOTE — Assessment & Plan Note (Addendum)
She has primarily nocturnal GERD characterized by regurgitation of gastric contents and frequent pyrosis.  Recommendations #1 switch dexilant to before dinner. She was instructed to take a second dose in the morning if she has frequent symptoms during the day

## 2012-03-16 ENCOUNTER — Telehealth: Payer: Self-pay | Admitting: Gastroenterology

## 2012-03-16 NOTE — Telephone Encounter (Signed)
Diarrhea should not prevent her from having either a colonoscopy or upper endoscopy. I will encourage her to have her procedure tomorrow.

## 2012-03-17 ENCOUNTER — Encounter: Payer: Medicare Other | Admitting: Gastroenterology

## 2012-04-15 ENCOUNTER — Encounter: Payer: Self-pay | Admitting: Gastroenterology

## 2012-04-15 ENCOUNTER — Other Ambulatory Visit: Payer: Self-pay

## 2012-04-15 ENCOUNTER — Ambulatory Visit (AMBULATORY_SURGERY_CENTER): Payer: Medicare Other | Admitting: Gastroenterology

## 2012-04-15 VITALS — BP 172/73 | HR 72 | Temp 98.0°F | Resp 20 | Ht 62.0 in | Wt 147.0 lb

## 2012-04-15 DIAGNOSIS — R1310 Dysphagia, unspecified: Secondary | ICD-10-CM

## 2012-04-15 DIAGNOSIS — R1084 Generalized abdominal pain: Secondary | ICD-10-CM

## 2012-04-15 DIAGNOSIS — R109 Unspecified abdominal pain: Secondary | ICD-10-CM

## 2012-04-15 DIAGNOSIS — Z800 Family history of malignant neoplasm of digestive organs: Secondary | ICD-10-CM

## 2012-04-15 DIAGNOSIS — K222 Esophageal obstruction: Secondary | ICD-10-CM

## 2012-04-15 MED ORDER — SODIUM CHLORIDE 0.9 % IV SOLN: 500.0000 mL | INTRAVENOUS | Status: DC

## 2012-04-15 NOTE — Patient Instructions (Addendum)

## 2012-04-15 NOTE — Op Note (Signed)
[facility] [address] [place], [place]  [address]  COLONOSCOPY PROCEDURE REPORT  PATIENT:  Melissa Herring, Melissa Herring  MR#:  [id number] BIRTHDATE:  1931/08/22, 80 yrs. old  GENDER:  female ENDOSCOPIST:  Barbette Hair. Arlyce Dice, MD REF. BY:  Feliciana Rossetti, M.D. PROCEDURE DATE:  04/15/2012 PROCEDURE:  Diagnostic Colonoscopy ASA CLASS:  Class II INDICATIONS:  Abdominal pain, family history of colon cancer Daughter MEDICATIONS:   MAC sedation, administered by CRNA propofol 150mg IV  DESCRIPTION OF PROCEDURE:   After the risks benefits and alternatives of the procedure were thoroughly explained, informed consent was obtained.  Digital rectal exam was performed and revealed no abnormalities.   The LB CF-H180AL [id number] endoscope was introduced through the anus and advanced to the cecum, which was identified by both the appendix and ileocecal valve, without limitations.  The quality of the prep was good, using MoviPrep. The instrument was then slowly withdrawn as the colon was fully examined. <<PROCEDUREIMAGES>>  FINDINGS:  Severe diverticulosis was found in the sigmoid colon (see image2). Muscular hypertrophy and lumenal narrowing  This was otherwise a normal examination of the colon (see image3, image4, and image6).   Retroflexed views in the rectum revealed no abnormalities.    The time to cecum =  1) 6.50  minutes. The scope was then withdrawn in  1) 5.75  minutes from the cecum and the procedure completed. COMPLICATIONS:  None ENDOSCOPIC IMPRESSION: 1) Severe diverticulosis in the sigmoid colon 2) Otherwise normal examination RECOMMENDATIONS: 1) Call office for follow-up appointment in 4 weeks REPEAT EXAM:  No  ______________________________ Barbette Hair. Arlyce Dice, MD  CC:  n. eSIGNED:   Barbette Hair. AmeLie Hollars at 04/15/2012 03:13 PM  Karma Greaser, [phone]

## 2012-04-15 NOTE — Op Note (Signed)
[facility] [address] [place], [place]  [address]  ENDOSCOPY PROCEDURE REPORT  PATIENT:  Melissa, Herring  MR#:  [id number] BIRTHDATE:  [birth year], 80 yrs. old  GENDER:  female  ENDOSCOPIST:  Barbette Hair. Arlyce Dice, MD Referred by:  Feliciana Rossetti, M.D.  PROCEDURE DATE:  04/15/2012 PROCEDURE:  EGD, diagnostic 43235, Maloney Dilation of Esophagus ASA CLASS:  Class II INDICATIONS:  dysphagia, dyspepsia  MEDICATIONS:   There was residual sedation effect present from prior procedure., MAC sedation, administered by CRNA propofol 50mg IV, glycopyrrolate (Robinal) 0.2 mg IV TOPICAL ANESTHETIC:  DESCRIPTION OF PROCEDURE:   After the risks and benefits of the procedure were explained, informed consent was obtained.  The LB GIF-H180 [id number] endoscope was introduced through the mouth and advanced to the third portion of the duodenum.  The instrument was slowly withdrawn as the mucosa was fully examined. <<PROCEDUREIMAGES>>  A stricture was found at the gastroesophageal junction (see image1). Early stricture Dilation with maloney dilator 18mm Mild resistance; no heme  Otherwise the examination was normal (see image2 and image3).    Retroflexed views revealed no abnormalities.    The scope was then withdrawn from the patient and the procedure completed.  COMPLICATIONS:  None  ENDOSCOPIC IMPRESSION: 1) Stricture at the gastroesophageal junction - s/p maloney dilitation 2) Otherwise normal examination RECOMMENDATIONS: 1) Call office next 2-3 days to schedule an office appointment for 4weeks 2) gastric emptying scan  ______________________________ Barbette Hair. Arlyce Dice, MD  CC:  n. eSIGNED:   Barbette Hair. Shamarcus Hoheisel at 04/15/2012 03:18 PM  Karma Greaser, [phone]

## 2012-04-15 NOTE — Progress Notes (Signed)
Patient did not experience any of the following events: a burn prior to discharge; a fall within the facility; wrong site/side/patient/procedure/implant event; or a hospital transfer or hospital admission upon discharge from the facility. (G8907) Patient did not have preoperative order for IV antibiotic SSI prophylaxis. (G8918)  

## 2012-04-16 ENCOUNTER — Telehealth: Payer: Self-pay

## 2012-04-16 NOTE — Telephone Encounter (Signed)
Left message on answering machine. 

## 2012-04-17 ENCOUNTER — Telehealth: Payer: Self-pay

## 2012-04-17 NOTE — Telephone Encounter (Signed)
GES scheduled at [facility] 04/23/12 arrival time 9:45am for a 10am appt. Pt to be NPO after midnight and hold Dexilant. Pt states this date will not work for her. Pt given phone number [phone] to reschedule the procedure to a date that will work for her.

## 2012-04-23 ENCOUNTER — Other Ambulatory Visit (HOSPITAL_COMMUNITY): Payer: Medicare Other

## 2012-04-28 ENCOUNTER — Encounter (HOSPITAL_COMMUNITY)
Admission: RE | Admit: 2012-04-28 | Discharge: 2012-04-28 | Disposition: A | Discharge status: Home / Self Care | Payer: Medicare Other | Source: Ambulatory Visit | Attending: Gastroenterology | Admitting: Gastroenterology

## 2012-04-28 ENCOUNTER — Other Ambulatory Visit: Payer: Self-pay | Admitting: Gastroenterology

## 2012-04-28 DIAGNOSIS — E119 Type 2 diabetes mellitus without complications: Secondary | ICD-10-CM | POA: Insufficient documentation

## 2012-04-28 DIAGNOSIS — D649 Anemia, unspecified: Secondary | ICD-10-CM

## 2012-04-28 DIAGNOSIS — R109 Unspecified abdominal pain: Secondary | ICD-10-CM | POA: Insufficient documentation

## 2012-04-28 DIAGNOSIS — R142 Eructation: Secondary | ICD-10-CM | POA: Insufficient documentation

## 2012-04-28 DIAGNOSIS — R112 Nausea with vomiting, unspecified: Secondary | ICD-10-CM | POA: Insufficient documentation

## 2012-04-28 DIAGNOSIS — R141 Gas pain: Secondary | ICD-10-CM | POA: Insufficient documentation

## 2012-04-28 MED ORDER — TECHNETIUM TC 99M SULFUR COLLOID
2.2000 | Freq: Once | INTRAVENOUS | Status: AC | PRN
  Administered 2012-04-28: 2.2 via INTRAVENOUS

## 2012-05-12 ENCOUNTER — Encounter: Payer: Self-pay | Admitting: Gastroenterology

## 2012-05-12 ENCOUNTER — Ambulatory Visit (INDEPENDENT_AMBULATORY_CARE_PROVIDER_SITE_OTHER): Payer: Medicare Other | Admitting: Gastroenterology

## 2012-05-12 VITALS — BP 142/80 | HR 67 | Ht 62.0 in | Wt 145.4 lb

## 2012-05-12 DIAGNOSIS — R1032 Left lower quadrant pain: Secondary | ICD-10-CM

## 2012-05-12 DIAGNOSIS — K219 Gastro-esophageal reflux disease without esophagitis: Secondary | ICD-10-CM

## 2012-05-12 DIAGNOSIS — R1310 Dysphagia, unspecified: Secondary | ICD-10-CM

## 2012-05-12 DIAGNOSIS — R1084 Generalized abdominal pain: Secondary | ICD-10-CM

## 2012-05-12 MED ORDER — HYOSCYAMINE SULFATE 0.125 MG SL SUBL: 0.2500 mg | SUBLINGUAL_TABLET | SUBLINGUAL | Status: DC | PRN

## 2012-05-12 NOTE — Progress Notes (Signed)
History of Present Illness:  Melissa Herring has returned following dilation of a distal esophageal stricture,  and colonoscopy. The latter was remarkable only for diverticulosis. Gastric empty scan was unremarkable. Dysphagia to liquids is much improved although she still has mild dysphagia to solids. She's has to sit up at night because of free regurgitation of gastric contents. Although instructed to take dexilant at nighttime she still taken it during the day only. She complains of intermittent sharp lower costal pain.    Review of Systems: He is having some difficulty seeing at night which she attributes to cataracts Pertinent positive and negative review of systems were noted in the above HPI section. All other review of systems were otherwise negative.    Current Medications, Allergies, Past Medical History, Past Surgical History, Family History and Social History were reviewed in [employer] [facility] electronic medical record  Vital signs were reviewed in today's medical record. Physical Exam: General: Well developed , well nourished, no acute distress

## 2012-05-12 NOTE — Patient Instructions (Addendum)
Your Upper GI Series test is scheduled on 05/15/2012 at 11:30am you will need to arrive at [facility] at 11:15am Nothing to eat or drink 6 hours before test

## 2012-05-12 NOTE — Assessment & Plan Note (Signed)
Persistent symptoms of dysphagia solids could be do to a motility disorder. If she has a large hiatal hernia with stomach in her chest this could account for symptoms to. Finally, she may have a persistent stricture.  Recommendations #1 upper GI series

## 2012-05-12 NOTE — Assessment & Plan Note (Addendum)
She has free regurgitation of gastric contents when lying recumbent. I explained to her that this is likely due to a incompetent LES and that surgery would be required.  She feels that she can live with the symptoms and does not want to pursue this any further.  Recommendations #1 the patient was instructed to take dexilant at night rather than the morning although she can take a second dose in the morning if necessary #2 upper GI series

## 2012-05-12 NOTE — Assessment & Plan Note (Signed)
Pain is very nonspecific and probably due to spasm.  Recommendations #1 hyomax when necessary

## 2012-05-15 ENCOUNTER — Other Ambulatory Visit (HOSPITAL_COMMUNITY): Payer: Medicare Other

## 2012-05-15 ENCOUNTER — Ambulatory Visit (HOSPITAL_COMMUNITY)
Admission: RE | Admit: 2012-05-15 | Discharge: 2012-05-15 | Disposition: A | Discharge status: Home / Self Care | Payer: Medicare Other | Source: Ambulatory Visit | Attending: Gastroenterology | Admitting: Gastroenterology

## 2012-05-15 DIAGNOSIS — K449 Diaphragmatic hernia without obstruction or gangrene: Secondary | ICD-10-CM | POA: Insufficient documentation

## 2012-05-15 DIAGNOSIS — R1032 Left lower quadrant pain: Secondary | ICD-10-CM

## 2012-05-15 DIAGNOSIS — R1084 Generalized abdominal pain: Secondary | ICD-10-CM

## 2012-05-15 DIAGNOSIS — R1310 Dysphagia, unspecified: Secondary | ICD-10-CM

## 2012-05-15 DIAGNOSIS — K219 Gastro-esophageal reflux disease without esophagitis: Secondary | ICD-10-CM

## 2012-05-15 NOTE — Progress Notes (Signed)
Quick Note:  Please inform the patient that UGI series was not very remarkable and to continue current plan of action ______

## 2014-06-21 ENCOUNTER — Encounter: Payer: Self-pay | Admitting: Cardiovascular Disease

## 2014-07-05 ENCOUNTER — Encounter: Payer: Self-pay | Admitting: Cardiovascular Disease

## 2014-07-05 DIAGNOSIS — Z79899 Other long term (current) drug therapy: Secondary | ICD-10-CM

## 2014-07-05 NOTE — Progress Notes (Signed)
EKG  DONE  FOR [facility]  ORIGINAL  GIVEN TO PT .Zack Seal/CY

## 2014-07-19 ENCOUNTER — Ambulatory Visit (INDEPENDENT_AMBULATORY_CARE_PROVIDER_SITE_OTHER): Payer: Self-pay | Admitting: *Deleted

## 2014-07-19 DIAGNOSIS — Z006 Encounter for examination for normal comparison and control in clinical research program: Secondary | ICD-10-CM

## 2014-07-21 NOTE — Progress Notes (Signed)
[facility] EKG

## 2014-08-16 ENCOUNTER — Encounter: Payer: Self-pay | Admitting: Cardiovascular Disease

## 2014-08-16 ENCOUNTER — Ambulatory Visit (INDEPENDENT_AMBULATORY_CARE_PROVIDER_SITE_OTHER): Payer: Self-pay | Admitting: Cardiovascular Disease

## 2014-08-16 DIAGNOSIS — R0602 Shortness of breath: Secondary | ICD-10-CM

## 2014-08-16 NOTE — Progress Notes (Signed)
Patient ID: Melissa Herring, female   DOB: Mar 06, 1931, 80 y.o.   MRN: [id number]

## 2014-09-13 ENCOUNTER — Ambulatory Visit (INDEPENDENT_AMBULATORY_CARE_PROVIDER_SITE_OTHER): Payer: Medicare Other

## 2014-09-13 DIAGNOSIS — Z049 Encounter for examination and observation for unspecified reason: Secondary | ICD-10-CM

## 2014-10-11 ENCOUNTER — Ambulatory Visit: Payer: Medicare Other | Admitting: *Deleted

## 2014-10-11 DIAGNOSIS — Z006 Encounter for examination for normal comparison and control in clinical research program: Secondary | ICD-10-CM

## 2014-10-11 NOTE — Progress Notes (Signed)
[facility] research patient here for EKG EKG done. Signed by Dr. Eldridge DaceVaranasi (DOD). Given to patient to take with her to urology today.

## 2016-10-03 ENCOUNTER — Other Ambulatory Visit: Payer: Self-pay | Admitting: Neurosurgery

## 2016-10-03 DIAGNOSIS — R5381 Other malaise: Secondary | ICD-10-CM

## 2018-03-10 ENCOUNTER — Other Ambulatory Visit: Payer: Self-pay | Admitting: Orthopedic Surgery

## 2018-03-10 DIAGNOSIS — M545 Low back pain: Principal | ICD-10-CM

## 2018-03-10 DIAGNOSIS — G8929 Other chronic pain: Secondary | ICD-10-CM

## 2018-03-26 ENCOUNTER — Ambulatory Visit
Admission: RE | Admit: 2018-03-26 | Discharge: 2018-03-26 | Disposition: A | Discharge status: Home / Self Care | Payer: Medicare Other | Source: Ambulatory Visit | Attending: Orthopedic Surgery | Admitting: Orthopedic Surgery

## 2018-03-26 DIAGNOSIS — M545 Low back pain: Principal | ICD-10-CM

## 2018-03-26 DIAGNOSIS — G8929 Other chronic pain: Secondary | ICD-10-CM

## 2018-03-26 MED ORDER — DIAZEPAM 5 MG PO TABS
5.0000 mg | ORAL_TABLET | Freq: Once | ORAL | Status: AC
  Administered 2018-03-26: 5 mg via ORAL

## 2018-03-26 MED ORDER — IOPAMIDOL (ISOVUE-M 200) INJECTION 41%
15.0000 mL | Freq: Once | INTRAMUSCULAR | Status: AC
  Administered 2018-03-26: 15 mL via EPIDURAL

## 2018-03-26 MED ORDER — MEPERIDINE HCL 100 MG/ML IJ SOLN
75.0000 mg | Freq: Once | INTRAMUSCULAR | Status: AC
  Administered 2018-03-26: 75 mg via INTRAMUSCULAR

## 2018-03-26 NOTE — Discharge Instructions (Signed)

## 2018-04-17 ENCOUNTER — Encounter: Payer: Self-pay | Admitting: Internal Medicine

## 2018-05-26 ENCOUNTER — Ambulatory Visit (INDEPENDENT_AMBULATORY_CARE_PROVIDER_SITE_OTHER): Payer: Medicare Other | Admitting: Internal Medicine

## 2018-05-26 ENCOUNTER — Encounter: Payer: Self-pay | Admitting: Internal Medicine

## 2018-05-26 VITALS — BP 142/78 | HR 80 | Ht 62.0 in | Wt 146.0 lb

## 2018-05-26 DIAGNOSIS — R05 Cough: Secondary | ICD-10-CM | POA: Diagnosis not present

## 2018-05-26 DIAGNOSIS — R918 Other nonspecific abnormal finding of lung field: Secondary | ICD-10-CM | POA: Diagnosis not present

## 2018-05-26 DIAGNOSIS — R059 Cough, unspecified: Secondary | ICD-10-CM

## 2018-05-26 DIAGNOSIS — I10 Essential (primary) hypertension: Secondary | ICD-10-CM

## 2018-05-26 MED ORDER — ESOMEPRAZOLE MAGNESIUM 40 MG PO CPDR: DELAYED_RELEASE_CAPSULE | ORAL | 2 refills | Status: AC

## 2018-05-26 MED ORDER — ESOMEPRAZOLE MAGNESIUM 40 MG PO CPDR: DELAYED_RELEASE_CAPSULE | ORAL | 2 refills | Status: DC

## 2018-05-26 MED ORDER — CLONIDINE HCL 0.2 MG PO TABS: 0.2000 mg | ORAL_TABLET | Freq: Two times a day (BID) | ORAL | 11 refills | Status: DC

## 2018-05-26 NOTE — Patient Instructions (Addendum)
Place the second patch of clonidine on and when it's expired  go ahead and start clonidine 0.2 mg twice  Daily   Take nexium Take 30- 60 min before your first and last meals of the day   GERD (REFLUX)  is an extremely common cause of respiratory symptoms just like yours , many times with no obvious heartburn at all.    It can be treated with medication, but also with lifestyle changes including elevation of the head of your bed (ideally with 6 inch  bed blocks),  Smoking cessation, avoidance of late meals, excessive alcohol, and avoid fatty foods, chocolate, peppermint, colas, red wine, and acidic juices such as orange juice.  NO MINT OR MENTHOL PRODUCTS SO NO COUGH DROPS   USE SUGARLESS CANDY INSTEAD (Jolley ranchers or Stover's or Life Savers) or even ice chips will also do - the key is to swallow to prevent all throat clearing. NO OIL BASED VITAMINS - use powdered substitutes.    You are cleared for surgery on your back from a pulmonary perspective assuming it is going to be done here in [place] as inpatient.

## 2018-05-26 NOTE — Progress Notes (Signed)
Melissa Herring, female    DOB: 1931-10-23,    MRN: [id number]   Brief patient profile:  84 yowf never smoker with chronic dry cough esp if flat on  Her back x around 2016 much better since stopped ACEi around 1st week in July 2019 referred to pulmonary clinic 05/26/2018 by Dr   Melissa Herring for preop eval. Previously carried dx of bronchiectasis with chronic resp distress per PCP notes and ? chf with cards w/u preop showing nl echo6/14/19 showing mild lvh, nl LA     05/26/2018 New pt eval/ Melissa Herring   Cc : I can't live with this back pain, esp on R shooting all the way to my foot"   Chief Complaint  Patient presents with  . Pulmonary Consult    Referred by Dr. Darrelyn Herring for pulmonary clearance for spinal surgery. She only c/o occ non prod cough at night.   Dyspnea:  Limited by back not breathing  Cough: much better since stopped acei  Sleep: better one pillow and arm or in recliner due to sensation of noct acid reflux  PPi in am only assoc with throat clearing but really not much cough.  No obvious day to day or daytime variability or assoc excess/ purulent sputum or mucus plugs or hemoptysis or cp or chest tightness, subjective wheeze or overt sinus symptoms.   Also denies any obvious fluctuation of symptoms with weather or environmental changes or other aggravating or alleviating factors except as outlined above   No unusual exposure hx or h/o childhood pna/ asthma or knowledge of premature birth.  Current Allergies, Complete Past Medical History, Past Surgical History, Family History, and Social History were reviewed in [employer] record.  ROS  The following are not active complaints unless bolded Hoarseness, sore throat, dysphagia, dental problems, itching, sneezing,  nasal congestion or discharge of excess mucus or purulent secretions, ear ache,   fever, chills, sweats, unintended wt loss or wt gain, classically pleuritic or exertional cp,  orthopnea pnd or arm/hand swelling   or leg swelling, presyncope, palpitations, abdominal pain, anorexia, nausea, vomiting, diarrhea  or change in bowel habits or change in bladder habits, change in stools or change in urine, dysuria, hematuria,  rash, arthralgias/ severe radicular L 5 pain on R , visual complaints, headache, numbness, weakness or ataxia or problems with walking or coordination,  change in mood or  memory.             Past Medical History:  Diagnosis Date  . Diverticulitis     Outpatient Medications Prior to Visit  Medication Sig Dispense Refill  . albuterol (PROVENTIL HFA;VENTOLIN HFA) 108 (90 Base) MCG/ACT inhaler Inhale 2 puffs into the lungs every 4 (four) hours as needed.    Marland Kitchen aspirin EC 81 MG tablet Take 81 mg by mouth daily.    Marland Kitchen atorvastatin (LIPITOR) 10 MG tablet Take 10 mg by mouth daily.    . cloNIDine (CATAPRES - DOSED IN MG/24 HR) 0.1 mg/24hr patch Place 0.1 mg onto the skin once a week.    . esomeprazole (NEXIUM) 40 MG capsule Take 40 mg by mouth daily at 12 noon.    . gabapentin (NEURONTIN) 300 MG capsule Take 600 mg by mouth 3 (three) times daily.    Marland Kitchen losartan (COZAAR) 50 MG tablet Take 50 mg by mouth daily. Thinks she stopped    . DEXILANT 60 MG capsule TAKE ONE CAPSULE BY MOUTH EVERY DAY 30 capsule 6  . hyoscyamine (  LEVSIN SL) 0.125 MG SL tablet Place 2 tablets (0.25 mg total) under the tongue every 4 (four) hours as needed for cramping. 30 tablet 1  . lisinopril (PRINIVIL,ZESTRIL) 20 MG tablet Take 20 mg by mouth daily. Thinks she stopped    . Na Sulfate-K Sulfate-Mg Sulf SOLN Take 2 Bottles by mouth as directed. 2 Bottle 0  . oxybutynin (DITROPAN-XL) 10 MG 24 hr tablet Take 10 mg by mouth daily.              Objective:     BP (!) 142/78 (BP Location: Left Arm, Cuff Size: Normal)   Pulse 80   Ht 5\' 2"  (1.575 m)   Wt 146 lb (66.2 kg)   SpO2 95%   BMI 26.70 kg/m   SpO2: 95 % RA  amb wf very shaky on details of care, names of meds, prefers to relate what doctors have told  her rather than what's actually bothering her.   HEENT: nl dentition, turbinates bilaterally, and oropharynx. Nl external ear canals without cough reflex   NECK :  without JVD/Nodes/TM/ nl carotid upstrokes bilaterally   LUNGS: no acc muscle use,  Nl contour chest which is clear to A and P bilaterally without cough on insp or exp maneuvers   CV:  RRR  no s3 or murmur or increase in P2, and no edema   ABD:  soft and nontender with nl inspiratory excursion in the supine position. No bruits or organomegaly appreciated, bowel sounds nl  MS:  Very slow  gait/ ext warm without deformities, calf tenderness, cyanosis or clubbing/ Pos SLR on R   SKIN: warm and dry without lesions    NEURO:  alert, approp, nl sensorium with  no motor or cerebellar deficits apparent.    I personally reviewed images and agree with radiology impression as follows:   Chest CT 04/20/18  Large HH,  mpns none over 6 mm assoc with some tree in bud changes   Also 05/15/12  DgEs Moderate HH with GERD    Assessment   COUGH, CHRONIC Most likely this is Upper airway cough syndrome (previously labeled PNDS),  is so named because it's frequently impossible to sort out how much is  CR/sinusitis with freq throat clearing (which can be related to primary GERD)   vs  causing  secondary (" extra esophageal")  GERD from wide swings in gastric pressure that occur with throat clearing, often  promoting self use of mint and menthol lozenges that reduce the lower esophageal sphincter tone and exacerbate the problem further in a cyclical fashion.   These are the same pts (now being labeled as having "irritable larynx syndrome" by some cough centers) who not infrequently have a history of having failed to tolerate ace inhibitors and sometimes ARB as well,  dry powder inhalers or biphosphonates or report having atypical/extraesophageal reflux symptoms that don't respond to standard doses of PPI  and are easily confused as having aecopd or  asthma flares by even experienced allergists/ pulmonologists (myself included).   She has clear lungs and minimal microscopic changes on CT (see separate a/p) but only ongoing concern is noct cough /throat clearing related to clear cut HH/GERD so rec  1) ppi bid ac  2) diet  3) elevate HOB 10 degrees 4) permanently d/c acei, add back arb if needed once all coughing has been eliminated 5) cleared for surgery from pulmonary perspective but will need to accept higher risk at age 84 of post op resp complications -  greatest concern would be pna from asp from narcs/ ongoing gerd and low threshold to add reglan IV periop, early mobilization/ min narcs.  Discussed in detail all the  indications, usual  risks and alternatives  relative to the benefits with patient who agrees to proceed with surgery pending formal cards clearance.      Essential hypertension D/cd  acei around May 18 2018 and cough better   In the best review of chronic cough to date ( NEJM 2016 375 [id number]) ,  ACEi are now felt to cause cough in up to  20% of pts which is a 4 fold increase from previous reports and does not include the variety of non-specific complaints we see in pulmonary clinic in pts on ACEi but previously attributed to another dx like  Copd/asthma and  include PNDS, throat and chest congestion, "bronchitis", unexplained dyspnea and noct "strangling" sensations, and hoarseness, but also  atypical /refractory GERD symptoms like dysphagia and "bad heartburn"   The only way I know  to prove this is not an "ACEi Case" is a trial off ACEi x a minimum of 6 weeks then regroup.   Would also avoid arb in this setting and just titrate up clonidine to 0.2 bid which will also help with pain management.   Multiple pulmonary nodules determined by computed tomography of lung See scan 04/20/18 none over 6 mm in never smoker > reminder for repeat 10/20/18   This is an extremely common benign condition in the elderly and does not  warrant aggressive eval/ rx at this point unless there is a clinical correlation suggesting unaddressed pulmonary infection (purulent sputum, night sweats, unintended wt loss, doe) or evolution of  obvious changes on plain cxr (as opposed to serial CT, which is way over sensitive to make clinical decisions re intervention and treatment in the elderly, who tend to tolerate both dx and treatment poorly) .   Discussed in detail all the  indications, usual  risks and alternatives  relative to the benefits with patient who agrees to proceed with conservative f/u as outlined      Total time devoted to counseling  > 50 % of initial 60 min office visit:  review case with pt/ discussion of options/alternatives/ personally creating written customized instructions  in presence of pt  then going over those specific  Instructions directly with the pt including how to use all of the meds but in particular covering each new medication in detail and the difference between the maintenance= "automatic" meds and the prns using an action plan format for the latter (If this problem/symptom => do that organization reading Left to right).  Please see AVS from this visit for a full list of these instructions which I personally wrote for this pt and  are unique to this visit.      Melissa Hughs, MD 05/26/2018

## 2018-05-27 ENCOUNTER — Encounter: Payer: Self-pay | Admitting: Internal Medicine

## 2018-05-27 DIAGNOSIS — R918 Other nonspecific abnormal finding of lung field: Secondary | ICD-10-CM | POA: Insufficient documentation

## 2018-05-27 NOTE — Assessment & Plan Note (Signed)
D/cd  acei around May 18 2018 and cough better   In the best review of chronic cough to date ( NEJM 2016 375 [id number]) ,  ACEi are now felt to cause cough in up to  20% of pts which is a 4 fold increase from previous reports and does not include the variety of non-specific complaints we see in pulmonary clinic in pts on ACEi but previously attributed to another dx like  Copd/asthma and  include PNDS, throat and chest congestion, "bronchitis", unexplained dyspnea and noct "strangling" sensations, and hoarseness, but also  atypical /refractory GERD symptoms like dysphagia and "bad heartburn"   The only way I know  to prove this is not an "ACEi Case" is a trial off ACEi x a minimum of 6 weeks then regroup.   Would also avoid arb in this setting and just titrate up clonidine to 0.2 bid which will also help with pain management.

## 2018-05-27 NOTE — Assessment & Plan Note (Addendum)
Most likely this is Upper airway cough syndrome (previously labeled PNDS),  is so named because it's frequently impossible to sort out how much is  CR/sinusitis with freq throat clearing (which can be related to primary GERD)   vs  causing  secondary (" extra esophageal")  GERD from wide swings in gastric pressure that occur with throat clearing, often  promoting self use of mint and menthol lozenges that reduce the lower esophageal sphincter tone and exacerbate the problem further in a cyclical fashion.   These are the same pts (now being labeled as having "irritable larynx syndrome" by some cough centers) who not infrequently have a history of having failed to tolerate ace inhibitors and sometimes ARB as well,  dry powder inhalers or biphosphonates or report having atypical/extraesophageal reflux symptoms that don't respond to standard doses of PPI  and are easily confused as having aecopd or asthma flares by even experienced allergists/ pulmonologists (myself included).   She has clear lungs and minimal microscopic changes on CT (see separate a/p) but only ongoing concern is noct cough /throat clearing related to clear cut HH/GERD so rec  1) ppi bid ac  2) diet  3) elevate HOB 10 degrees 4) permanently d/c acei, add back arb if needed once all coughing has been eliminated 5) cleared for surgery from pulmonary perspective but will need to accept higher risk at age 84 of post op resp complications - greatest concern would be pna from asp from narcs/ ongoing gerd and low threshold to add reglan IV periop, early mobilization/ min narcs.  Discussed in detail all the  indications, usual  risks and alternatives  relative to the benefits with patient who agrees to proceed with surgery pending formal cards clearance.

## 2018-05-27 NOTE — Assessment & Plan Note (Signed)
See scan 04/20/18 none over 6 mm in never smoker > reminder for repeat 10/20/18   This is an extremely common benign condition in the elderly and does not warrant aggressive eval/ rx at this point unless there is a clinical correlation suggesting unaddressed pulmonary infection (purulent sputum, night sweats, unintended wt loss, doe) or evolution of  obvious changes on plain cxr (as opposed to serial CT, which is way over sensitive to make clinical decisions re intervention and treatment in the elderly, who tend to tolerate both dx and treatment poorly) .   Discussed in detail all the  indications, usual  risks and alternatives  relative to the benefits with patient who agrees to proceed with conservative f/u as outlined      Total time devoted to counseling  > 50 % of initial 60 min office visit:  review case with pt/ discussion of options/alternatives/ personally creating written customized instructions  in presence of pt  then going over those specific  Instructions directly with the pt including how to use all of the meds but in particular covering each new medication in detail and the difference between the maintenance= "automatic" meds and the prns using an action plan format for the latter (If this problem/symptom => do that organization reading Left to right).  Please see AVS from this visit for a full list of these instructions which I personally wrote for this pt and  are unique to this visit.

## 2018-06-05 NOTE — Progress Notes (Signed)
06-05-18 Pulmonary clearance from Dr. Sherene SiresWert 04-21-18  Cardiac Clearance Dr. Molly Madurolevenger  05-01-18 (Chart Everywhere) ECHO

## 2018-06-05 NOTE — Patient Instructions (Addendum)
Ward ChattersHilda C Vankleeck  06/05/2018   Your procedure is scheduled on: 06-12-18   [facility]    Report to Admitting at 11:15 AM    Call this number if you have problems the morning of surgery [phone]   Remember: Do not eat food or drink liquids :After Midnight.You may have a Clear Liquid Diet from Midnight until 7:15 AM. After 7:15 AM, nothing until after surgery     CLEAR LIQUID DIET   Foods Allowed                                                                     Foods Excluded  Coffee and tea, regular and decaf                             liquids that you cannot  Plain Jell-O in any flavor                                             see through such as: Fruit ices (not with fruit pulp)                                     milk, soups, orange juice  Iced Popsicles                                    All solid food Carbonated beverages, regular and diet                                    Cranberry, grape and apple juices Sports drinks like Gatorade Lightly seasoned clear broth or consume(fat free) Sugar, honey syrup  Sample Menu Breakfast                                Lunch                                     Supper Cranberry juice                    Beef broth                            Chicken broth Jell-O                                     Grape juice  Apple juice Coffee or tea                        Jell-O                                      Popsicle                                                Coffee or tea                        Coffee or tea  _____________________________________________________________________     Take these medicines the morning of surgery with A SIP OF WATER: Esomeprazole (Nexium), Gabapentin (Neurontin), and Clonidine (Catapres). You may bring and use your eyedrops as needed.                                You may not have any metal on your body including hair pins and               piercings  Do not wear jewelry, make-up, lotions, powders or perfumes, deodorant             Do not wear nail polish.  Do not shave  48 hours prior to surgery.              Do not bring valuables to the hospital. [facility] IS NOT             RESPONSIBLE   FOR VALUABLES.  Contacts, dentures or bridgework may not be worn into surgery.  Leave suitcase in the car. After surgery it may be brought to your room.      Special Instructions: N/A              Please read over the following fact sheets you were given: _____________________________________________________________________             [facility] - Preparing for Surgery Before surgery, you can play an important role.  Because skin is not sterile, your skin needs to be as free of germs as possible.  You can reduce the number of germs on your skin by washing with CHG (chlorahexidine gluconate) soap before surgery.  CHG is an antiseptic cleaner which kills germs and bonds with the skin to continue killing germs even after washing. Please DO NOT use if you have an allergy to CHG or antibacterial soaps.  If your skin becomes reddened/irritated stop using the CHG and inform your nurse when you arrive at Short Stay. Do not shave (including legs and underarms) for at least 48 hours prior to the first CHG shower.  You may shave your face/neck. Please follow these instructions carefully:  1.  Shower with CHG Soap the night before surgery and the  morning of Surgery.  2.  If you choose to wash your hair, wash your hair first as usual with your  normal  shampoo.  3.  After you shampoo, rinse your hair and body thoroughly to remove the  shampoo.  4.  Use CHG as you would any other liquid soap.  You can apply chg directly  to the skin and wash                       Gently with a scrungie or clean washcloth.  5.  Apply the CHG Soap to your body ONLY FROM THE NECK DOWN.   Do not use on face/ open                            Wound or open sores. Avoid contact with eyes, ears mouth and genitals (private parts).                       Wash face,  Genitals (private parts) with your normal soap.             6.  Wash thoroughly, paying special attention to the area where your surgery  will be performed.  7.  Thoroughly rinse your body with warm water from the neck down.  8.  DO NOT shower/wash with your normal soap after using and rinsing off  the CHG Soap.                9.  Pat yourself dry with a clean towel.            10.  Wear clean pajamas.            11.  Place clean sheets on your bed the night of your first shower and do not  sleep with pets. Day of Surgery : Do not apply any lotions/deodorants the morning of surgery.  Please wear clean clothes to the hospital/surgery center.  FAILURE TO FOLLOW THESE INSTRUCTIONS MAY RESULT IN THE CANCELLATION OF YOUR SURGERY PATIENT SIGNATURE_________________________________  NURSE SIGNATURE__________________________________  ________________________________________________________________________   Adam Phenix  An incentive spirometer is a tool that can help keep your lungs clear and active. This tool measures how well you are filling your lungs with each breath. Taking long deep breaths may help reverse or decrease the chance of developing breathing (pulmonary) problems (especially infection) following:  A long period of time when you are unable to move or be active. BEFORE THE PROCEDURE   If the spirometer includes an indicator to show your best effort, your nurse or respiratory therapist will set it to a desired goal.  If possible, sit up straight or lean slightly forward. Try not to slouch.  Hold the incentive spirometer in an upright position. INSTRUCTIONS FOR USE  1. Sit on the edge of your bed if possible, or sit up as far as you can in bed or on a chair. 2. Hold the incentive spirometer in an upright position. 3. Breathe out  normally. 4. Place the mouthpiece in your mouth and seal your lips tightly around it. 5. Breathe in slowly and as deeply as possible, raising the piston or the ball toward the top of the column. 6. Hold your breath for 3-5 seconds or for as long as possible. Allow the piston or ball to fall to the bottom of the column. 7. Remove the mouthpiece from your mouth and breathe out normally. 8. Rest for a few seconds and repeat Steps 1 through 7 at least 10 times every 1-2 hours when you are awake. Take your time and take a few normal breaths between deep breaths. 9. The spirometer may include an indicator to  show your best effort. Use the indicator as a goal to work toward during each repetition. 10. After each set of 10 deep breaths, practice coughing to be sure your lungs are clear. If you have an incision (the cut made at the time of surgery), support your incision when coughing by placing a pillow or rolled up towels firmly against it. Once you are able to get out of bed, walk around indoors and cough well. You may stop using the incentive spirometer when instructed by your caregiver.  RISKS AND COMPLICATIONS  Take your time so you do not get dizzy or light-headed.  If you are in pain, you may need to take or ask for pain medication before doing incentive spirometry. It is harder to take a deep breath if you are having pain. AFTER USE  Rest and breathe slowly and easily.  It can be helpful to keep track of a log of your progress. Your caregiver can provide you with a simple table to help with this. If you are using the spirometer at home, follow these instructions: [place] IF:   You are having difficultly using the spirometer.  You have trouble using the spirometer as often as instructed.  Your pain medication is not giving enough relief while using the spirometer.  You develop fever of 100.5 F (38.1 C) or higher. SEEK IMMEDIATE MEDICAL CARE IF:   You cough up bloody sputum  that had not been present before.  You develop fever of 102 F (38.9 C) or greater.  You develop worsening pain at or near the incision site. MAKE SURE YOU:   Understand these instructions.  Will watch your condition.  Will get help right away if you are not doing well or get worse. Document Released: 03/17/2007 Document Revised: 01/27/2012 Document Reviewed: 05/18/2007 Exit[employer] Patient Information 2014 Exit[employer], [place].   ________________________________________________________________________  WHAT IS A BLOOD TRANSFUSION? Blood Transfusion Information  A transfusion is the replacement of blood or some of its parts. Blood is made up of multiple cells which provide different functions.  Red blood cells carry oxygen and are used for blood loss replacement.  White blood cells fight against infection.  Platelets control bleeding.  Plasma helps clot blood.  Other blood products are available for specialized needs, such as hemophilia or other clotting disorders. BEFORE THE TRANSFUSION  Who gives blood for transfusions?   Healthy volunteers who are fully evaluated to make sure their blood is safe. This is blood bank blood. Transfusion therapy is the safest it has ever been in the practice of medicine. Before blood is taken from a donor, a complete history is taken to make sure that person has no history of diseases nor engages in risky social behavior (examples are intravenous drug use or sexual activity with multiple partners). The donor's travel history is screened to minimize risk of transmitting infections, such as malaria. The donated blood is tested for signs of infectious diseases, such as HIV and hepatitis. The blood is then tested to be sure it is compatible with you in order to minimize the chance of a transfusion reaction. If you or a relative donates blood, this is often done in anticipation of surgery and is not appropriate for emergency situations. It takes many days to  process the donated blood. RISKS AND COMPLICATIONS Although transfusion therapy is very safe and saves many lives, the main dangers of transfusion include:   Getting an infectious disease.  Developing a transfusion reaction. This is an allergic reaction  to something in the blood you were given. Every precaution is taken to prevent this. The decision to have a blood transfusion has been considered carefully by your caregiver before blood is given. Blood is not given unless the benefits outweigh the risks. AFTER THE TRANSFUSION  Right after receiving a blood transfusion, you will usually feel much better and more energetic. This is especially true if your red blood cells have gotten low (anemic). The transfusion raises the level of the red blood cells which carry oxygen, and this usually causes an energy increase.  The nurse administering the transfusion will monitor you carefully for complications. HOME CARE INSTRUCTIONS  No special instructions are needed after a transfusion. You may find your energy is better. Speak with your caregiver about any limitations on activity for underlying diseases you may have. SEEK MEDICAL CARE IF:   Your condition is not improving after your transfusion.  You develop redness or irritation at the intravenous (IV) site. SEEK IMMEDIATE MEDICAL CARE IF:  Any of the following symptoms occur over the next 12 hours:  Shaking chills.  You have a temperature by mouth above 102 F (38.9 C), not controlled by medicine.  Chest, back, or muscle pain.  People around you feel you are not acting correctly or are confused.  Shortness of breath or difficulty breathing.  Dizziness and fainting.  You get a rash or develop hives.  You have a decrease in urine output.  Your urine turns a dark color or changes to pink, red, or brown. Any of the following symptoms occur over the next 10 days:  You have a temperature by mouth above 102 F (38.9 C), not controlled by  medicine.  Shortness of breath.  Weakness after normal activity.  The white part of the eye turns yellow (jaundice).  You have a decrease in the amount of urine or are urinating less often.  Your urine turns a dark color or changes to pink, red, or brown. Document Released: 11/01/2000 Document Revised: 01/27/2012 Document Reviewed: 06/20/2008 [facility] Patient Information 2014 [place], [place].  _______________________________________________________________________

## 2018-06-08 ENCOUNTER — Other Ambulatory Visit: Payer: Self-pay

## 2018-06-08 ENCOUNTER — Encounter (HOSPITAL_COMMUNITY)
Admission: RE | Admit: 2018-06-08 | Discharge: 2018-06-08 | Disposition: A | Discharge status: Home / Self Care | Payer: Medicare Other | Source: Ambulatory Visit | Attending: Orthopedic Surgery | Admitting: Orthopedic Surgery

## 2018-06-08 ENCOUNTER — Encounter (HOSPITAL_COMMUNITY): Payer: Self-pay

## 2018-06-08 ENCOUNTER — Ambulatory Visit (HOSPITAL_COMMUNITY)
Admission: RE | Admit: 2018-06-08 | Discharge: 2018-06-08 | Disposition: A | Discharge status: Home / Self Care | Payer: Medicare Other | Source: Ambulatory Visit | Attending: Surgical | Admitting: Surgical

## 2018-06-08 DIAGNOSIS — M545 Low back pain, unspecified: Secondary | ICD-10-CM

## 2018-06-08 DIAGNOSIS — Z01812 Encounter for preprocedural laboratory examination: Secondary | ICD-10-CM | POA: Diagnosis present

## 2018-06-08 DIAGNOSIS — Z01818 Encounter for other preprocedural examination: Secondary | ICD-10-CM | POA: Insufficient documentation

## 2018-06-08 HISTORY — DX: Gastro-esophageal reflux disease without esophagitis: K21.9

## 2018-06-08 LAB — CBC WITH DIFFERENTIAL/PLATELET
Basophils Absolute: 0 10*3/uL (ref 0.0–0.1)
Basophils Relative: 0 %
Eosinophils Absolute: 0.2 10*3/uL (ref 0.0–0.7)
Eosinophils Relative: 2 %
HCT: 43.2 % (ref 36.0–46.0)
Hemoglobin: 14 g/dL (ref 12.0–15.0)
Lymphocytes Relative: 41 %
Lymphs Abs: 2.8 10*3/uL (ref 0.7–4.0)
MCH: 27.9 pg (ref 26.0–34.0)
MCHC: 32.4 g/dL (ref 30.0–36.0)
MCV: 86.2 fL (ref 78.0–100.0)
Monocytes Absolute: 0.4 10*3/uL (ref 0.1–1.0)
Monocytes Relative: 5 %
Neutro Abs: 3.5 10*3/uL (ref 1.7–7.7)
Neutrophils Relative %: 52 %
Platelets: 230 10*3/uL (ref 150–400)
RBC: 5.01 MIL/uL (ref 3.87–5.11)
RDW: 14 % (ref 11.5–15.5)
WBC: 6.8 10*3/uL (ref 4.0–10.5)

## 2018-06-08 LAB — SURGICAL PCR SCREEN
MRSA, PCR: NEGATIVE
Staphylococcus aureus: NEGATIVE

## 2018-06-08 LAB — COMPREHENSIVE METABOLIC PANEL
ALT: 14 U/L (ref 0–44)
AST: 18 U/L (ref 15–41)
Albumin: 3.9 g/dL (ref 3.5–5.0)
Alkaline Phosphatase: 63 U/L (ref 38–126)
Anion gap: 6 (ref 5–15)
BUN: 12 mg/dL (ref 8–23)
CO2: 27 mmol/L (ref 22–32)
Calcium: 10.3 mg/dL (ref 8.9–10.3)
Chloride: 112 mmol/L — ABNORMAL HIGH (ref 98–111)
Creatinine, Ser: 0.57 mg/dL (ref 0.44–1.00)
GFR calc Af Amer: >60 mL/min (ref >60)
GFR calc non Af Amer: >60 mL/min (ref >60)
Glucose, Bld: 98 mg/dL (ref 70–99)
Potassium: 5.2 mmol/L — ABNORMAL HIGH (ref 3.5–5.1)
Sodium: 145 mmol/L (ref 135–145)
Total Bilirubin: 0.5 mg/dL (ref 0.3–1.2)
Total Protein: 6.8 g/dL (ref 6.5–8.1)

## 2018-06-08 LAB — PROTIME-INR
INR: 1.22
Prothrombin Time: 15.3 seconds — ABNORMAL HIGH (ref 11.4–15.2)

## 2018-06-08 LAB — ABO/RH: ABO/RH(D): A POS

## 2018-06-08 LAB — APTT: aPTT: 31 seconds (ref 24–36)

## 2018-06-08 NOTE — Progress Notes (Signed)
06-08-18 PT results routed to Dr. Darrelyn HillockGioffre for review.  PAT chart with Effie Shyameika, RN

## 2018-06-12 ENCOUNTER — Encounter (HOSPITAL_COMMUNITY)
Admission: RE | Disposition: A | Discharge status: Home / Self Care | Payer: Self-pay | Source: Ambulatory Visit | Attending: Orthopedic Surgery

## 2018-06-12 ENCOUNTER — Ambulatory Visit (HOSPITAL_COMMUNITY): Payer: Medicare Other

## 2018-06-12 ENCOUNTER — Encounter (HOSPITAL_COMMUNITY): Payer: Self-pay | Admitting: *Deleted

## 2018-06-12 ENCOUNTER — Other Ambulatory Visit: Payer: Self-pay

## 2018-06-12 ENCOUNTER — Ambulatory Visit (HOSPITAL_COMMUNITY): Payer: Medicare Other | Admitting: Certified Registered Nurse Anesthetist

## 2018-06-12 ENCOUNTER — Inpatient Hospital Stay (HOSPITAL_COMMUNITY)
Admission: RE | Admit: 2018-06-12 | Discharge: 2018-06-15 | DRG: 517 | Disposition: A | Discharge status: Home / Self Care | Payer: Medicare Other | Source: Ambulatory Visit | Attending: Orthopedic Surgery | Admitting: Orthopedic Surgery

## 2018-06-12 DIAGNOSIS — Z9049 Acquired absence of other specified parts of digestive tract: Secondary | ICD-10-CM | POA: Diagnosis not present

## 2018-06-12 DIAGNOSIS — Z880 Allergy status to penicillin: Secondary | ICD-10-CM | POA: Diagnosis not present

## 2018-06-12 DIAGNOSIS — M549 Dorsalgia, unspecified: Secondary | ICD-10-CM | POA: Diagnosis present

## 2018-06-12 DIAGNOSIS — Z882 Allergy status to sulfonamides status: Secondary | ICD-10-CM

## 2018-06-12 DIAGNOSIS — I951 Orthostatic hypotension: Secondary | ICD-10-CM | POA: Diagnosis not present

## 2018-06-12 DIAGNOSIS — M419 Scoliosis, unspecified: Secondary | ICD-10-CM | POA: Diagnosis present

## 2018-06-12 DIAGNOSIS — K5900 Constipation, unspecified: Secondary | ICD-10-CM | POA: Diagnosis not present

## 2018-06-12 DIAGNOSIS — Z803 Family history of malignant neoplasm of breast: Secondary | ICD-10-CM

## 2018-06-12 DIAGNOSIS — Z7983 Long term (current) use of bisphosphonates: Secondary | ICD-10-CM

## 2018-06-12 DIAGNOSIS — M48062 Spinal stenosis, lumbar region with neurogenic claudication: Secondary | ICD-10-CM | POA: Diagnosis present

## 2018-06-12 DIAGNOSIS — Z90710 Acquired absence of both cervix and uterus: Secondary | ICD-10-CM

## 2018-06-12 DIAGNOSIS — Z800 Family history of malignant neoplasm of digestive organs: Secondary | ICD-10-CM | POA: Diagnosis not present

## 2018-06-12 DIAGNOSIS — K219 Gastro-esophageal reflux disease without esophagitis: Secondary | ICD-10-CM | POA: Diagnosis present

## 2018-06-12 DIAGNOSIS — Z7982 Long term (current) use of aspirin: Secondary | ICD-10-CM | POA: Diagnosis not present

## 2018-06-12 DIAGNOSIS — Z419 Encounter for procedure for purposes other than remedying health state, unspecified: Secondary | ICD-10-CM

## 2018-06-12 HISTORY — PX: LUMBAR LAMINECTOMY/DECOMPRESSION MICRODISCECTOMY: SHX5026

## 2018-06-12 LAB — TYPE AND SCREEN
ABO/RH(D): A POS
Antibody Screen: NEGATIVE

## 2018-06-12 SURGERY — LUMBAR LAMINECTOMY/DECOMPRESSION MICRODISCECTOMY 1 LEVEL
Anesthesia: General

## 2018-06-12 MED ORDER — HYDROCODONE-ACETAMINOPHEN 5-325 MG PO TABS
1.0000 | ORAL_TABLET | ORAL | Status: DC | PRN
  Administered 2018-06-13 – 2018-06-15 (×2): 1 via ORAL
  Filled 2018-06-12 (×2): qty 1

## 2018-06-12 MED ORDER — ONDANSETRON HCL 4 MG/2ML IJ SOLN
INTRAMUSCULAR | Status: AC
  Filled 2018-06-12: qty 2

## 2018-06-12 MED ORDER — HYDROCODONE-ACETAMINOPHEN 10-325 MG PO TABS
2.0000 | ORAL_TABLET | ORAL | Status: DC | PRN
  Administered 2018-06-13: 2 via ORAL
  Filled 2018-06-12 (×3): qty 2

## 2018-06-12 MED ORDER — CLONIDINE HCL 0.1 MG/24HR TD PTWK: 0.1000 mg | MEDICATED_PATCH | TRANSDERMAL | Status: DC

## 2018-06-12 MED ORDER — SODIUM CHLORIDE 0.9 % IV SOLN
INTRAVENOUS | Status: DC | PRN
  Administered 2018-06-12: 500 mL

## 2018-06-12 MED ORDER — ONDANSETRON HCL 4 MG/2ML IJ SOLN
4.0000 mg | Freq: Four times a day (QID) | INTRAMUSCULAR | Status: DC | PRN
  Administered 2018-06-14: 4 mg via INTRAVENOUS
  Filled 2018-06-12: qty 2

## 2018-06-12 MED ORDER — ONDANSETRON HCL 4 MG/2ML IJ SOLN: 4.0000 mg | Freq: Once | INTRAMUSCULAR | Status: DC | PRN

## 2018-06-12 MED ORDER — THROMBIN (RECOMBINANT) 5000 UNITS EX SOLR
CUTANEOUS | Status: AC
  Filled 2018-06-12: qty 10000

## 2018-06-12 MED ORDER — DEXAMETHASONE SODIUM PHOSPHATE 10 MG/ML IJ SOLN
INTRAMUSCULAR | Status: DC | PRN
  Administered 2018-06-12: 10 mg via INTRAVENOUS

## 2018-06-12 MED ORDER — LACTATED RINGERS IV SOLN: INTRAVENOUS | Status: DC

## 2018-06-12 MED ORDER — CHLORHEXIDINE GLUCONATE 4 % EX LIQD: 60.0000 mL | Freq: Once | CUTANEOUS | Status: DC

## 2018-06-12 MED ORDER — EPHEDRINE SULFATE 50 MG/ML IJ SOLN
INTRAMUSCULAR | Status: DC | PRN
  Administered 2018-06-12: 10 mg via INTRAVENOUS

## 2018-06-12 MED ORDER — ONDANSETRON HCL 4 MG PO TABS: 4.0000 mg | ORAL_TABLET | Freq: Four times a day (QID) | ORAL | Status: DC | PRN

## 2018-06-12 MED ORDER — ACETAMINOPHEN 650 MG RE SUPP: 650.0000 mg | RECTAL | Status: DC | PRN

## 2018-06-12 MED ORDER — PROPOFOL 10 MG/ML IV BOLUS
INTRAVENOUS | Status: DC | PRN
  Administered 2018-06-12: 130 mg via INTRAVENOUS

## 2018-06-12 MED ORDER — LABETALOL HCL 5 MG/ML IV SOLN
10.0000 mg | Freq: Once | INTRAVENOUS | Status: AC
  Administered 2018-06-12: 5 mg via INTRAVENOUS

## 2018-06-12 MED ORDER — OXYCODONE HCL 5 MG/5ML PO SOLN
5.0000 mg | Freq: Once | ORAL | Status: DC | PRN
  Filled 2018-06-12: qty 5

## 2018-06-12 MED ORDER — GLYCOPYRROLATE 0.2 MG/ML IJ SOLN
INTRAMUSCULAR | Status: DC | PRN
  Administered 2018-06-12: 0.2 mg via INTRAVENOUS

## 2018-06-12 MED ORDER — PANTOPRAZOLE SODIUM 40 MG PO TBEC
40.0000 mg | DELAYED_RELEASE_TABLET | Freq: Every day | ORAL | Status: DC
  Administered 2018-06-13 – 2018-06-15 (×3): 40 mg via ORAL
  Filled 2018-06-12 (×3): qty 1

## 2018-06-12 MED ORDER — ONDANSETRON HCL 4 MG/2ML IJ SOLN
INTRAMUSCULAR | Status: DC | PRN
  Administered 2018-06-12: 4 mg via INTRAVENOUS

## 2018-06-12 MED ORDER — FENTANYL CITRATE (PF) 100 MCG/2ML IJ SOLN
25.0000 ug | INTRAMUSCULAR | Status: DC | PRN
  Administered 2018-06-12 (×2): 25 ug via INTRAVENOUS

## 2018-06-12 MED ORDER — DEXAMETHASONE SODIUM PHOSPHATE 10 MG/ML IJ SOLN
INTRAMUSCULAR | Status: AC
  Filled 2018-06-12: qty 1

## 2018-06-12 MED ORDER — BUPIVACAINE-EPINEPHRINE (PF) 0.5% -1:200000 IJ SOLN
INTRAMUSCULAR | Status: AC
  Filled 2018-06-12: qty 30

## 2018-06-12 MED ORDER — FENTANYL CITRATE (PF) 100 MCG/2ML IJ SOLN
INTRAMUSCULAR | Status: AC
  Administered 2018-06-12: 25 ug via INTRAVENOUS
  Filled 2018-06-12: qty 2

## 2018-06-12 MED ORDER — SODIUM CHLORIDE 0.9 % IV SOLN
INTRAVENOUS | Status: AC
  Filled 2018-06-12: qty 500000

## 2018-06-12 MED ORDER — LIDOCAINE 2% (20 MG/ML) 5 ML SYRINGE
INTRAMUSCULAR | Status: DC | PRN
  Administered 2018-06-12: 60 mg via INTRAVENOUS

## 2018-06-12 MED ORDER — FENTANYL CITRATE (PF) 100 MCG/2ML IJ SOLN
INTRAMUSCULAR | Status: DC | PRN
  Administered 2018-06-12: 25 ug via INTRAVENOUS
  Administered 2018-06-12 (×2): 50 ug via INTRAVENOUS
  Administered 2018-06-12 (×3): 25 ug via INTRAVENOUS

## 2018-06-12 MED ORDER — MENTHOL 3 MG MT LOZG: 1.0000 | LOZENGE | OROMUCOSAL | Status: DC | PRN

## 2018-06-12 MED ORDER — ROCURONIUM BROMIDE 50 MG/5ML IV SOSY
PREFILLED_SYRINGE | INTRAVENOUS | Status: DC | PRN
  Administered 2018-06-12: 5 mg via INTRAVENOUS
  Administered 2018-06-12: 50 mg via INTRAVENOUS
  Administered 2018-06-12: 5 mg via INTRAVENOUS

## 2018-06-12 MED ORDER — LABETALOL HCL 5 MG/ML IV SOLN
INTRAVENOUS | Status: AC
  Administered 2018-06-12: 5 mg via INTRAVENOUS
  Filled 2018-06-12: qty 4

## 2018-06-12 MED ORDER — GABAPENTIN 300 MG PO CAPS
600.0000 mg | ORAL_CAPSULE | Freq: Four times a day (QID) | ORAL | Status: DC
  Administered 2018-06-12 – 2018-06-15 (×9): 600 mg via ORAL
  Filled 2018-06-12 (×9): qty 2

## 2018-06-12 MED ORDER — TETRAHYDROZOLINE HCL 0.05 % OP SOLN: 1.0000 [drp] | Freq: Every day | OPHTHALMIC | Status: DC | PRN

## 2018-06-12 MED ORDER — FENTANYL CITRATE (PF) 100 MCG/2ML IJ SOLN
INTRAMUSCULAR | Status: AC
  Filled 2018-06-12: qty 2

## 2018-06-12 MED ORDER — LACTATED RINGERS IV SOLN
INTRAVENOUS | Status: DC
  Administered 2018-06-12 (×2): via INTRAVENOUS

## 2018-06-12 MED ORDER — BUPIVACAINE LIPOSOME 1.3 % IJ SUSP
INTRAMUSCULAR | Status: DC | PRN
  Administered 2018-06-12: 20 mL

## 2018-06-12 MED ORDER — BUPIVACAINE-EPINEPHRINE (PF) 0.25% -1:200000 IJ SOLN
INTRAMUSCULAR | Status: AC
  Filled 2018-06-12: qty 30

## 2018-06-12 MED ORDER — PHENYLEPHRINE 40 MCG/ML (10ML) SYRINGE FOR IV PUSH (FOR BLOOD PRESSURE SUPPORT)
PREFILLED_SYRINGE | INTRAVENOUS | Status: DC | PRN
  Administered 2018-06-12: 40 ug via INTRAVENOUS
  Administered 2018-06-12 (×3): 80 ug via INTRAVENOUS

## 2018-06-12 MED ORDER — BUPIVACAINE LIPOSOME 1.3 % IJ SUSP
20.0000 mL | Freq: Once | INTRAMUSCULAR | Status: DC
Start: 2018-06-12 — End: 2018-06-12
  Filled 2018-06-12: qty 20

## 2018-06-12 MED ORDER — BISACODYL 5 MG PO TBEC
5.0000 mg | DELAYED_RELEASE_TABLET | Freq: Every day | ORAL | Status: DC | PRN
  Administered 2018-06-14: 5 mg via ORAL
  Filled 2018-06-12: qty 1

## 2018-06-12 MED ORDER — VANCOMYCIN HCL IN DEXTROSE 1-5 GM/200ML-% IV SOLN
1000.0000 mg | INTRAVENOUS | Status: AC
  Administered 2018-06-12: 1000 mg via INTRAVENOUS
  Filled 2018-06-12: qty 200

## 2018-06-12 MED ORDER — POLYETHYLENE GLYCOL 3350 17 G PO PACK
17.0000 g | PACK | Freq: Every day | ORAL | Status: DC | PRN
  Administered 2018-06-14: 17 g via ORAL
  Filled 2018-06-12: qty 1

## 2018-06-12 MED ORDER — PHENOL 1.4 % MT LIQD: 1.0000 | OROMUCOSAL | Status: DC | PRN

## 2018-06-12 MED ORDER — LIDOCAINE 2% (20 MG/ML) 5 ML SYRINGE
INTRAMUSCULAR | Status: AC
Start: 2018-06-12 — End: ?
  Filled 2018-06-12: qty 5

## 2018-06-12 MED ORDER — FLEET ENEMA 7-19 GM/118ML RE ENEM
1.0000 | ENEMA | Freq: Once | RECTAL | Status: AC | PRN
  Administered 2018-06-14: 1 via RECTAL
  Filled 2018-06-12: qty 1

## 2018-06-12 MED ORDER — METHOCARBAMOL 1000 MG/10ML IJ SOLN
500.0000 mg | Freq: Four times a day (QID) | INTRAVENOUS | Status: DC | PRN
  Administered 2018-06-12: 500 mg via INTRAVENOUS
  Filled 2018-06-12: qty 550

## 2018-06-12 MED ORDER — ACETAMINOPHEN 325 MG PO TABS
650.0000 mg | ORAL_TABLET | ORAL | Status: DC | PRN
  Administered 2018-06-14: 650 mg via ORAL
  Filled 2018-06-12: qty 2

## 2018-06-12 MED ORDER — PROPOFOL 10 MG/ML IV BOLUS
INTRAVENOUS | Status: AC
Start: 2018-06-12 — End: ?
  Filled 2018-06-12: qty 20

## 2018-06-12 MED ORDER — BACITRACIN-NEOMYCIN-POLYMYXIN 400-5-5000 EX OINT
TOPICAL_OINTMENT | CUTANEOUS | Status: AC
  Filled 2018-06-12: qty 1

## 2018-06-12 MED ORDER — LIP MEDEX EX OINT
TOPICAL_OINTMENT | CUTANEOUS | Status: AC
  Filled 2018-06-12: qty 7

## 2018-06-12 MED ORDER — ROCURONIUM BROMIDE 10 MG/ML (PF) SYRINGE
PREFILLED_SYRINGE | INTRAVENOUS | Status: AC
Start: 2018-06-12 — End: ?
  Filled 2018-06-12: qty 10

## 2018-06-12 MED ORDER — METHOCARBAMOL 500 MG PO TABS
500.0000 mg | ORAL_TABLET | Freq: Four times a day (QID) | ORAL | Status: DC | PRN
  Administered 2018-06-14 – 2018-06-15 (×2): 500 mg via ORAL
  Filled 2018-06-12 (×2): qty 1

## 2018-06-12 MED ORDER — BUPIVACAINE-EPINEPHRINE 0.5% -1:200000 IJ SOLN
INTRAMUSCULAR | Status: DC | PRN
  Administered 2018-06-12: 30 mL

## 2018-06-12 MED ORDER — OXYCODONE HCL 5 MG PO TABS: 5.0000 mg | ORAL_TABLET | Freq: Once | ORAL | Status: DC | PRN

## 2018-06-12 MED ORDER — CLONIDINE HCL 0.1 MG PO TABS
0.2000 mg | ORAL_TABLET | Freq: Two times a day (BID) | ORAL | Status: DC
  Administered 2018-06-12 – 2018-06-14 (×4): 0.2 mg via ORAL
  Filled 2018-06-12 (×4): qty 2

## 2018-06-12 MED ORDER — HYDROMORPHONE HCL 1 MG/ML IJ SOLN: 0.5000 mg | INTRAMUSCULAR | Status: DC | PRN

## 2018-06-12 MED ORDER — SUGAMMADEX SODIUM 200 MG/2ML IV SOLN
INTRAVENOUS | Status: DC | PRN
  Administered 2018-06-12: 150 mg via INTRAVENOUS

## 2018-06-12 SURGICAL SUPPLY — 56 items
AGENT HMST SPONGE THK3/8 (HEMOSTASIS) ×1
BAG DECANTER FOR FLEXI CONT (MISCELLANEOUS) ×3 IMPLANT
BAG SPEC THK2 15X12 ZIP CLS (MISCELLANEOUS)
BAG ZIPLOCK 12X15 (MISCELLANEOUS) IMPLANT
CLEANER TIP ELECTROSURG 2X2 (MISCELLANEOUS) ×3 IMPLANT
CLOSURE WOUND 1/2 X4 (GAUZE/BANDAGES/DRESSINGS)
COVER SURGICAL LIGHT HANDLE (MISCELLANEOUS) ×3 IMPLANT
DRAPE MICROSCOPE LEICA (MISCELLANEOUS) ×3 IMPLANT
DRAPE POUCH INSTRU U-SHP 10X18 (DRAPES) ×3 IMPLANT
DRAPE SHEET LG 3/4 BI-LAMINATE (DRAPES) ×3 IMPLANT
DRAPE SURG 17X11 SM STRL (DRAPES) ×3 IMPLANT
DRSG ADAPTIC 3X8 NADH LF (GAUZE/BANDAGES/DRESSINGS) ×3 IMPLANT
DRSG PAD ABDOMINAL 8X10 ST (GAUZE/BANDAGES/DRESSINGS) ×4 IMPLANT
DURAPREP 26ML APPLICATOR (WOUND CARE) ×3 IMPLANT
ELECT BLADE TIP CTD 4 INCH (ELECTRODE) ×3 IMPLANT
ELECT REM PT RETURN 15FT ADLT (MISCELLANEOUS) ×3 IMPLANT
GAUZE SPONGE 4X4 12PLY STRL (GAUZE/BANDAGES/DRESSINGS) ×3 IMPLANT
GLOVE BIOGEL PI IND STRL 6.5 (GLOVE) IMPLANT
GLOVE BIOGEL PI IND STRL 7.5 (GLOVE) IMPLANT
GLOVE BIOGEL PI IND STRL 8 (GLOVE) IMPLANT
GLOVE BIOGEL PI IND STRL 8.5 (GLOVE) ×1 IMPLANT
GLOVE BIOGEL PI INDICATOR 6.5 (GLOVE) ×2
GLOVE BIOGEL PI INDICATOR 7.5 (GLOVE) ×2
GLOVE BIOGEL PI INDICATOR 8 (GLOVE) ×2
GLOVE BIOGEL PI INDICATOR 8.5 (GLOVE) ×2
GLOVE ECLIPSE 8.0 STRL XLNG CF (GLOVE) ×3 IMPLANT
GLOVE ECLIPSE 8.5 STRL (GLOVE) ×2 IMPLANT
GLOVE SURG SS PI 6.5 STRL IVOR (GLOVE) ×2 IMPLANT
GOWN SRG XL XLNG 56XLVL 4 (GOWN DISPOSABLE) IMPLANT
GOWN STRL NON-REIN XL XLG LVL4 (GOWN DISPOSABLE) ×3
GOWN STRL REUS W/TWL LRG LVL3 (GOWN DISPOSABLE) ×2 IMPLANT
GOWN STRL REUS W/TWL XL LVL3 (GOWN DISPOSABLE) ×3 IMPLANT
HEMOSTAT SPONGE AVITENE ULTRA (HEMOSTASIS) ×3 IMPLANT
KIT BASIN OR (CUSTOM PROCEDURE TRAY) ×3 IMPLANT
KIT POSITIONING SURG ANDREWS (MISCELLANEOUS) IMPLANT
MANIFOLD NEPTUNE II (INSTRUMENTS) ×3 IMPLANT
NDL SPNL 18GX3.5 QUINCKE PK (NEEDLE) ×2 IMPLANT
NEEDLE HYPO 22GX1.5 SAFETY (NEEDLE) ×3 IMPLANT
NEEDLE SPNL 18GX3.5 QUINCKE PK (NEEDLE) ×6 IMPLANT
PACK LAMINECTOMY ORTHO (CUSTOM PROCEDURE TRAY) ×3 IMPLANT
PAD ABD 8X10 STRL (GAUZE/BANDAGES/DRESSINGS) ×2 IMPLANT
PATTIES SURGICAL .5 X.5 (GAUZE/BANDAGES/DRESSINGS) ×2 IMPLANT
PATTIES SURGICAL .75X.75 (GAUZE/BANDAGES/DRESSINGS) ×3 IMPLANT
PATTIES SURGICAL 1X1 (DISPOSABLE) ×3 IMPLANT
SPONGE LAP 4X18 RFD (DISPOSABLE) IMPLANT
STAPLER VISISTAT 35W (STAPLE) ×3 IMPLANT
STRIP CLOSURE SKIN 1/2X4 (GAUZE/BANDAGES/DRESSINGS) ×1 IMPLANT
SUT VIC AB 1 CT1 27 (SUTURE) ×6
SUT VIC AB 1 CT1 27XBRD ANTBC (SUTURE) ×2 IMPLANT
SUT VIC AB 2-0 CT1 27 (SUTURE) ×6
SUT VIC AB 2-0 CT1 TAPERPNT 27 (SUTURE) ×2 IMPLANT
SYR 20CC LL (SYRINGE) ×6 IMPLANT
TAPE CLOTH SURG 4X10 WHT LF (GAUZE/BANDAGES/DRESSINGS) ×2 IMPLANT
TOWEL OR 17X26 10 PK STRL BLUE (TOWEL DISPOSABLE) ×3 IMPLANT
TOWEL OR NON WOVEN STRL DISP B (DISPOSABLE) ×3 IMPLANT
TRAY FOLEY CATH 14FRSI W/METER (CATHETERS) ×2 IMPLANT

## 2018-06-12 NOTE — Op Note (Signed)
NAME: Ward ChattersSMITH, Syrah C. MEDICAL RECORD [id number] ACCOUNT [id number] DATE OF BIRTH:1931/05/16 FACILITY: [facility] LOCATION: [facility] PHYSICIAN:Jamiah Recore Sanjuana KavaA. Kalief Kattner, MD  OPERATIVE REPORT  DATE OF PROCEDURE:  06/12/2018  SURGEON:  Georges Lynchonald A. Darrelyn HillockGioffre, MD  ASSISTANT:  Dimitri PedAmber Constable PA-C.  PREOPERATIVE DIAGNOSES:   1.  Severe lumbar scoliosis. 2.  Spinal stenosis with a large defect at L4-L5 on the right side.   3.  She had previous weakness of her legs and severe pain.  POSTOPERATIVE DIAGNOSIS:  1.  Severe lumbar scoliosis. 2.  Spinal stenosis with a large defect at L4-L5 on the right side.   3.  She had previous weakness of her legs and severe pain.  PROCEDURES:   1.  A decompressive lumbar laminectomy, complete at L4-L5.  Exploration of the disk space at L4-L5 on the right.  Recess stenosis in the lower area of L3-L4 on the right. 2.  Decompression of the lateral recesses. 3.  Foraminotomy for the L4 and L5 root bilaterally. 4.  Partial hemilaminectomy at L3-L4 on the right.  DESCRIPTION OF PROCEDURE:  Under general anesthesia, routine orthopedic prep and draping of the lower back was carried out.  The patient was on the [place] frame.  She had 1 gram of vancomycin.  After sterile prep and draping and after the timeout (also I  did do the appropriate timeout and marked the right side of the back even though we were going central most of her symptoms were on the right).  We then placed 1 needles in the back for localization purposes.  X-ray was taken.  Following that, I  injected 20 mL of 0.5% Marcaine with epinephrine in the soft tissue to control the bleeding.  An incision then was made over L4-L5 and extended proximally and distally.  The muscle was stripped from the lamina and spinous processes bilaterally.  A Kocher  clamp was then placed.  An x-ray was taken that showed we were one level above.  Another x-ray was taken with another Kocher clamp in place.  Then we localized the area of  L4-L5.  Now this was difficult at the beginning to localize because of her  scoliosis.  Once we accepted the appropriate space, we then carried out our central decompressive lumbar laminectomy at L4-L5.  I then went up proximally as well to make sure that we covered the lower area 3-4.  I then went distally as well.  We brought  the microscope in, completed the laminectomies and then removed the ligamentum flavum.  I completed the dissection distally and proximally until we were completely free.  I did utilize a hockey stick to probe the area to make sure we were free.  I then  went out into the lateral recesses to decompress the spinal nerves as well.  We did foraminotomies for the L4 and L5 roots bilaterally.  Following that, we were able to easily pass a hockey stick proximally, distally and out the foramina without any  resistance.  I then explored the disk space that this was intact at L4-L5 on the right.  Note, most of her symptoms were on the right.  I thoroughly irrigated out the wound, loosely applied some thrombin-soaked Gelfoam.  I then closed the wound in layers  in the usual fashion and I left a small deep distal and proximal part open for drainage purposes, so we did not end up with cord compression.  The remaining part of the soft tissue was closed in the usual fashion.  The  skin was closed after we injected  20 mL of Exparel into the soft tissue.  The skin was closed with metal staples.  Sterile dressings were applied.    The patient left the operating room in satisfactory condition.  AN/NUANCE  D:06/12/2018 T:06/12/2018 JOB:[id number]

## 2018-06-12 NOTE — H&P (Signed)
Melissa Herring is an 84 y.o. female.   Chief Complaint: Pain in her back and legs. HPI: She has had progressive pain and weakness in her back and legs.  Past Medical History:  Diagnosis Date  . Diverticulitis   . GERD (gastroesophageal reflux disease)     Past Surgical History:  Procedure Laterality Date  . ABDOMINAL HYSTERECTOMY    . CHOLECYSTECTOMY    . TONSILLECTOMY      Family History  Problem Relation Age of Onset  . Colon cancer Daughter   . Breast cancer Mother   . Diverticulosis Father    Social History:  reports that she has never smoked. She has never used smokeless tobacco. She reports that she does not drink alcohol or use drugs.  Allergies:  Allergies  Allergen Reactions  . Penicillins Hives    Has patient had a PCN reaction causing immediate rash, facial/tongue/throat swelling, SOB or lightheadedness with hypotension: No Has patient had a PCN reaction causing severe rash involving mucus membranes or skin necrosis: Yes Has patient had a PCN reaction that required hospitalization: Yes Has patient had a PCN reaction occurring within the last 10 years: No If all of the above answers are "NO", then may proceed with Cephalosporin use.   . Sulfa Antibiotics Rash    Medications Prior to Admission  Medication Sig Dispense Refill  . alendronate (FOSAMAX) 70 MG tablet Take 70 mg by mouth every Friday. Take with a full glass of water on an empty stomach.    Marland Kitchen aspirin EC 81 MG tablet Take 81 mg by mouth daily.    Marland Kitchen atorvastatin (LIPITOR) 10 MG tablet Take 10 mg by mouth at bedtime.     . Cholecalciferol (VITAMIN D) 2000 units tablet Take 2,000 Units by mouth daily.    . cloNIDine (CATAPRES - DOSED IN MG/24 HR) 0.1 mg/24hr patch Place 0.1 mg onto the skin every Sunday.    . cloNIDine (CATAPRES) 0.2 MG tablet Take 1 tablet (0.2 mg total) by mouth 2 (two) times daily. 60 tablet 11  . esomeprazole (NEXIUM) 20 MG capsule Take 20 mg by mouth 2 (two) times daily.    Marland Kitchen  gabapentin (NEURONTIN) 600 MG tablet Take 600 mg by mouth See admin instructions. Take 600 mg by mouth 4 times daily may take a fifth 600 mg dose as needed pain    . Tetrahydrozoline HCl (VISINE OP) Place 1 drop into both eyes daily as needed (dry eyes).    . vitamin A 8000 UNIT capsule Take 8,000 Units by mouth daily.    . vitamin B-12 (CYANOCOBALAMIN) 1000 MCG tablet Take 1,000 mcg by mouth daily.    . vitamin E 400 UNIT capsule Take 400 Units by mouth daily.    . Coenzyme Q10 (COQ-10) 400 MG CAPS Take 400 mg by mouth daily.    Marland Kitchen esomeprazole (NEXIUM) 40 MG capsule Take 30- 60 min before your first and last meals of the day 60 capsule 2    No results found for this or any previous visit (from the past 48 hour(s)). No results found.  Review of Systems  Constitutional: Negative.   HENT: Negative.   Eyes: Negative.   Respiratory: Negative.   Cardiovascular: Negative.   Gastrointestinal: Negative.   Genitourinary: Negative.   Musculoskeletal: Positive for back pain.  Skin: Negative.   Neurological: Positive for weakness.  Endo/Heme/Allergies: Negative.   Psychiatric/Behavioral: Negative.     Blood pressure (!) 157/74, pulse 67, temperature 98.1 F (36.7 C), temperature  source Oral, resp. rate 18, height 5\' 2"  (1.575 m), weight 66.2 kg (146 lb), SpO2 95 %. Physical Exam  Constitutional: She appears well-developed.  HENT:  Head: Normocephalic.  Eyes: Pupils are equal, round, and reactive to light.  Cardiovascular: Normal rate.  Respiratory: Effort normal.  GI: Soft.  Musculoskeletal: She exhibits tenderness.  Neurological:  Slight weakness of her dorsiflexors of her feet.  Skin: Skin is warm.  Psychiatric: She has a normal mood and affect.     Assessment/Plan Dwcompressive Lumbar Laminectomy and possible Microdiscectomy at L-4-L-5  Ranee Gosselinonald Trinten Boudoin, MD 06/12/2018, 1:34 PM

## 2018-06-12 NOTE — Interval H&P Note (Signed)
History and Physical Interval Note:  06/12/2018 1:38 PM  [name]  has presented today for surgery, with the diagnosis of spinal stenosis  The various methods of treatment have been discussed with the patient and family. After consideration of risks, benefits and other options for treatment, the patient has consented to  Procedure(s): Central decompression lumbar laminectomy L4-5 (N/A) as a surgical intervention .  The patient's history has been reviewed, patient examined, no change in status, stable for surgery.  I have reviewed the patient's chart and labs.  Questions were answered to the patient's satisfaction.     Ranee Gosselinonald [name]

## 2018-06-12 NOTE — Anesthesia Procedure Notes (Signed)
Procedure Name: Intubation Date/Time: 06/12/2018 2:02 PM Performed by: West Pugh, CRNA Pre-anesthesia Checklist: Patient identified, Emergency Drugs available, Suction available, Patient being monitored and Timeout performed Patient Re-evaluated:Patient Re-evaluated prior to induction Oxygen Delivery Method: Circle system utilized Preoxygenation: Pre-oxygenation with 100% oxygen Induction Type: IV induction Ventilation: Two handed mask ventilation required Laryngoscope Size: Mac and 3 Grade View: Grade II Tube type: Oral Tube size: 7.0 mm Number of attempts: 1 Airway Equipment and Method: Stylet Placement Confirmation: ETT inserted through vocal cords under direct vision,  positive ETCO2,  CO2 detector and breath sounds checked- equal and bilateral Secured at: 20 cm Tube secured with: Tape Dental Injury: Teeth and Oropharynx as per pre-operative assessment

## 2018-06-12 NOTE — Transfer of Care (Signed)
Immediate Anesthesia Transfer of Care Note  Patient: Melissa Herring  Procedure(s) Performed: Central decompression lumbar laminectomy L4-5 (N/A )  Patient Location: PACU  Anesthesia Type:General  Level of Consciousness: awake, alert , oriented and patient cooperative  Airway & Oxygen Therapy: Patient Spontanous Breathing and Patient connected to face mask oxygen  Post-op Assessment: Report given to RN and Post -op Vital signs reviewed and stable  Post vital signs: Reviewed and stable  Last Vitals:  Vitals Value Taken Time  BP 153/90 06/12/2018  4:19 PM  Temp    Pulse 110 06/12/2018  4:27 PM  Resp 15 06/12/2018  4:27 PM  SpO2 100 % 06/12/2018  4:27 PM  Vitals shown include unvalidated device data.  Last Pain:  Vitals:   06/12/18 1110  TempSrc: Oral      Patients Stated Pain Goal: 4 (06/12/18 1124)  Complications: No apparent anesthesia complications

## 2018-06-12 NOTE — Anesthesia Preprocedure Evaluation (Addendum)
Anesthesia Evaluation  Patient identified by MRN, date of birth, ID band Patient awake    Reviewed: Allergy & Precautions, NPO status , Patient's Chart, lab work & pertinent test results  Airway Mallampati: II  TM Distance: >3 FB Neck ROM: Full    Dental  (+) Teeth Intact, Dental Advisory Given,    Pulmonary    breath sounds clear to auscultation       Cardiovascular  Rhythm:Regular Rate:Normal     Neuro/Psych    GI/Hepatic   Endo/Other    Renal/GU      Musculoskeletal   Abdominal   Peds  Hematology   Anesthesia Other Findings   Reproductive/Obstetrics                            Anesthesia Physical Anesthesia Plan  ASA: II  Anesthesia Plan: General   Post-op Pain Management:    Induction: Intravenous  PONV Risk Score and Plan: Ondansetron and Dexamethasone  Airway Management Planned: Oral ETT  Additional Equipment:   Intra-op Plan:   Post-operative Plan: Extubation in OR  Informed Consent: I have reviewed the patients History and Physical, chart, labs and discussed the procedure including the risks, benefits and alternatives for the proposed anesthesia with the patient or authorized representative who has indicated his/her understanding and acceptance.   Dental advisory given  Plan Discussed with: CRNA and Anesthesiologist  Anesthesia Plan Comments:         Anesthesia Quick Evaluation

## 2018-06-12 NOTE — Brief Op Note (Signed)
06/12/2018  3:52 PM  PATIENT:  Melissa Herring  84 y.o. female  PRE-OPERATIVE DIAGNOSIS:  spinal stenosis L-4-L-5 and Foraminal Stenosis involving the L-4 and L-5 Nerve Roots bilaterally  POST-OPERATIVE DIAGNOSIS:Same as Pre-Op  PROCEDURE:  Procedure(s): Central decompression lumbar laminectomy L4-5 (N/A) and Hemi Laminectomy L-3-L-4on the Right.Foraminotomies for the L-4 and L-5 Nerve roots ,bilaterally  SURGEON:  Surgeon(s) and Role:    Ranee Gosselin* Josiyah Tozzi, MD - Primary  PHYSICIAN ASSISTANT: Dimitri PedAmber Constable PA  ASSISTANTS: Dimitri PedAmber Constable PA  ANESTHESIA:   general  EBL:  100 mL   BLOOD ADMINISTERED:none  DRAINS: none   LOCAL MEDICATIONS USED:  MARCAINE 20cc of 00.50% with Epinephrine at the Start of the case and 20cc of Exparel at the end of the case.    SPECIMEN:  No Specimen  DISPOSITION OF SPECIMEN:  N/A  COUNTS:  YES  TOURNIQUET:  * No tourniquets in log *  DICTATION: .Other Dictation: Dictation Number [id number]  PLAN OF CARE: Admit to inpatient   PATIENT DISPOSITION:  PACU - hemodynamically stable.   Delay start of Pharmacological VTE agent (>24hrs) due to surgical blood loss or risk of bleeding: yes

## 2018-06-12 NOTE — Addendum Note (Signed)
Addendum  created 06/12/18 2222 by Wynonia SoursWalker, Gracious Renken L, CRNA   Charge Capture section accepted

## 2018-06-12 NOTE — Anesthesia Postprocedure Evaluation (Signed)
Anesthesia Post Note  Patient: Melissa Herring  Procedure(s) Performed: Central decompression lumbar laminectomy L4-5 (N/A )     Patient location during evaluation: PACU Anesthesia Type: General Level of consciousness: awake and alert Pain management: pain level controlled Vital Signs Assessment: post-procedure vital signs reviewed and stable Respiratory status: spontaneous breathing, nonlabored ventilation, respiratory function stable and patient connected to nasal cannula oxygen Cardiovascular status: blood pressure returned to baseline and stable Postop Assessment: no apparent nausea or vomiting Anesthetic complications: no    Last Vitals:  Vitals:   06/12/18 1700 06/12/18 1715  BP: (!) 176/86 (!) 163/82  Pulse: 91 87  Resp: 16 15  Temp:    SpO2: 100% 93%    Last Pain:  Vitals:   06/12/18 1715  TempSrc:   PainSc: 7                  [name]

## 2018-06-13 ENCOUNTER — Encounter (HOSPITAL_COMMUNITY): Payer: Self-pay | Admitting: Orthopedic Surgery

## 2018-06-13 MED ORDER — METHOCARBAMOL 500 MG PO TABS: 500.0000 mg | ORAL_TABLET | Freq: Four times a day (QID) | ORAL | 0 refills | Status: DC | PRN

## 2018-06-13 MED ORDER — VANCOMYCIN HCL IN DEXTROSE 1-5 GM/200ML-% IV SOLN
1000.0000 mg | Freq: Once | INTRAVENOUS | Status: AC
  Administered 2018-06-13: 1000 mg via INTRAVENOUS
  Filled 2018-06-13: qty 200

## 2018-06-13 MED ORDER — CALCIUM CARBONATE ANTACID 500 MG PO CHEW
2.0000 | CHEWABLE_TABLET | Freq: Four times a day (QID) | ORAL | Status: AC | PRN
  Administered 2018-06-13 (×3): 400 mg via ORAL
  Filled 2018-06-13 (×3): qty 2

## 2018-06-13 MED ORDER — HYDROCODONE-ACETAMINOPHEN 5-325 MG PO TABS: 1.0000 | ORAL_TABLET | ORAL | 0 refills | Status: DC | PRN

## 2018-06-13 NOTE — Evaluation (Signed)
Occupational Therapy Evaluation Patient Details Name: Melissa Herring MRN: [id number] DOB: 1931/03/20 Today's Date: 06/13/2018    History of Present Illness 84 yo female s/p Central decompression lumbar laminectomy; WG:NFAOHx:GERD, diverticulitis    Clinical Impression   Patient is s/p back surgery resulting in the deficits listed below (see OT Problem List).  Patient will benefit from skilled OT to increase their safety and independence with ADL and functional mobility for ADL (while adhering to their precautions) to facilitate discharge to venue listed below.      Follow Up Recommendations  Supervision/Assistance - 24 hour;No OT follow up    Equipment Recommendations  3 in 1 bedside commode    Recommendations for Other Services       Precautions / Restrictions Precautions Precautions: Fall;Back Precaution Booklet Issued: Yes (comment)      Mobility Bed Mobility Overal bed mobility: Needs Assistance Bed Mobility: Rolling;Sit to Sidelying Rolling: Min assist       Sit to sidelying: Min assist General bed mobility comments: cues for log roll technique, assist with LEs goign back to bed  Transfers Overall transfer level: Needs assistance Equipment used: Rolling walker (2 wheeled) Transfers: Sit to/from Stand Sit to Stand: Min assist         General transfer comment: assist to rise and stabilize, cues for back precautions, avoiding bending    Balance Overall balance assessment: Needs assistance Sitting-balance support: Feet supported;No upper extremity supported Sitting balance-Leahy Scale: Fair       Standing balance-Leahy Scale: Poor Standing balance comment: reliant on UEs                           ADL either performed or assessed with clinical judgement   ADL Overall ADL's : Needs assistance/impaired Eating/Feeding: Set up;Bed level   Grooming: Set up;Bed level   Upper Body Bathing: Minimal assistance;Sitting   Lower Body Bathing: Maximal  assistance;Sit to/from stand;Cueing for sequencing;Cueing for safety;Cueing for compensatory techniques;Cueing for back precautions;With adaptive equipment   Upper Body Dressing : Minimal assistance;Sitting   Lower Body Dressing: Maximal assistance;Sit to/from stand;Cueing for sequencing;Cueing for safety;With adaptive equipment;Cueing for compensatory techniques;Cueing for back precautions                       Vision Patient Visual Report: No change from baseline              Pertinent Vitals/Pain Pain Assessment: Faces Faces Pain Scale: Hurts little more Pain Location: back Pain Descriptors / Indicators: Sore;Discomfort Pain Intervention(s): Limited activity within patient's tolerance;Monitored during session     Hand Dominance     Extremity/Trunk Assessment Upper Extremity Assessment Upper Extremity Assessment: Generalized weakness   Lower Extremity Assessment Lower Extremity Assessment: Generalized weakness       Communication Communication Communication: No difficulties   Cognition Arousal/Alertness: Awake/alert Behavior During Therapy: WFL for tasks assessed/performed Overall Cognitive Status: Within Functional Limits for tasks assessed                                                Home Living Family/patient expects to be discharged to:: Private residence Living Arrangements: Spouse/significant other;Other (Comment) Available Help at Discharge: Family Type of Home: House Home Access: Stairs to enter Entrance Stairs-Number of Steps: 4   Home Layout: One level  Bathroom Shower/Tub: Chief Strategy Officer: Handicapped height     Home Equipment: Environmental consultant - 2 wheels;Hospital bed          Prior Functioning/Environment Level of Independence: Independent                 OT Problem List: Decreased strength;Decreased activity tolerance;Pain;Impaired balance (sitting and/or standing);Decreased knowledge of  precautions;Decreased knowledge of use of DME or AE      OT Treatment/Interventions: Self-care/ADL training;Patient/family education;DME and/or AE instruction    OT Goals(Current goals can be found in the care plan section) Acute Rehab OT Goals Patient Stated Goal: less pain, be able to walk OT Goal Formulation: With patient Time For Goal Achievement: 06/13/18  OT Frequency: Min 2X/week   Barriers to D/C:            Co-evaluation              AM-PAC PT "6 Clicks" Daily Activity     Outcome Measure Help from another person eating meals?: None Help from another person taking care of personal grooming?: A Little Help from another person toileting, which includes using toliet, bedpan, or urinal?: A Little Help from another person bathing (including washing, rinsing, drying)?: A Lot Help from another person to put on and taking off regular upper body clothing?: A Little Help from another person to put on and taking off regular lower body clothing?: A Lot 6 Click Score: 17   End of Session Equipment Utilized During Treatment: Rolling walker Nurse Communication: Mobility status  Activity Tolerance: Patient limited by pain Patient left: in bed  OT Visit Diagnosis: Unsteadiness on feet (R26.81);Muscle weakness (generalized) (M62.81);Pain                Time: 1000-1015 OT Time Calculation (min): 15 min Charges:  OT General Charges $OT Visit: 1 Visit OT Evaluation $OT Eval Moderate Complexity: 1 [address], [place] [phone]  Alba Cory 06/13/2018, 1:31 PM

## 2018-06-13 NOTE — Progress Notes (Signed)
Pharmacy Antibiotic Note  Melissa Herring is a 84 y.o. female admitted on 06/12/2018 with surgical prophylaxis.  Pharmacy has been consulted for vancomycin dosing. Patient s/p laminectomy 7/26, preop antibiotic given 7/26 at 12:30.    Plan:  Based estimated CrCl, OK to give vancomycin 1gm IV x 1 now ~24h after surgery  No further doses required as no drain in placed , pharmacy to sign-off  Height: 5\' 2"  (157.5 cm) Weight: 146 lb (66.2 kg) IBW/kg (Calculated) : 50.1  Temp (24hrs), Avg:97.8 F (36.6 C), Min:97.3 F (36.3 C), Max:98.3 F (36.8 C)  Recent Labs  Lab 06/08/18 1345  WBC 6.8  CREATININE 0.57    Estimated Creatinine Clearance: 46.7 mL/min (by C-G formula based on SCr of 0.57 mg/dL).    Allergies  Allergen Reactions  . Penicillins Hives    Has patient had a PCN reaction causing immediate rash, facial/tongue/throat swelling, SOB or lightheadedness with hypotension: No Has patient had a PCN reaction causing severe rash involving mucus membranes or skin necrosis: Yes Has patient had a PCN reaction that required hospitalization: Yes Has patient had a PCN reaction occurring within the last 10 years: No If all of the above answers are "NO", then may proceed with Cephalosporin use.   . Sulfa Antibiotics Rash   Thank you for allowing pharmacy to be a part of this patient's care.  Juliette Alcideustin Cassidy Tashiro, PharmD, BCPS.   Pager: [phone] 06/13/2018 11:22 AM

## 2018-06-13 NOTE — Evaluation (Signed)
Physical Therapy Evaluation Patient Details Name: Melissa Herring MRN: [id number] DOB: 05-25-1931 Today's Date: 06/13/2018   History of Present Illness  84 yo female s/p Central decompression lumbar laminectomy; WU:JWJX, diverticulitis   Clinical Impression  Pt admitted with above diagnosis. Pt currently with functional limitations due to the deficits listed below (see PT Problem List). Pt amb ~ 31' with RW and min assist, knee buckling x2; will benefit from another day of PT; continue to follow;   Pt will benefit from skilled PT to increase their independence and safety with mobility to allow discharge to the venue listed below.       Follow Up Recommendations Follow surgeon's recommendation for DC plan and follow-up therapies    Equipment Recommendations  None recommended by PT    Recommendations for Other Services       Precautions / Restrictions Precautions Precautions: Fall;Back Precaution Booklet Issued: Yes (comment)      Mobility  Bed Mobility Overal bed mobility: Needs Assistance Bed Mobility: Rolling;Sit to Sidelying Rolling: Min assist       Sit to sidelying: Min assist General bed mobility comments: cues for log roll technique, assist with LEs goign back to bed  Transfers Overall transfer level: Needs assistance Equipment used: Rolling walker (2 wheeled) Transfers: Sit to/from Stand Sit to Stand: Min assist         General transfer comment: assist to rise and stabilize, cues for back precautions, avoiding bending  Ambulation/Gait Ambulation/Gait assistance: Min assist Gait Distance (Feet): 50 Feet Assistive device: Rolling walker (2 wheeled) Gait Pattern/deviations: Step-to pattern;Decreased step length - left;Decreased stance time - right     General Gait Details: cues for RW position and posture; knee buckling x2 with min assist for balance  Stairs            Wheelchair Mobility    Modified Rankin (Stroke Patients Only)        Balance Overall balance assessment: Needs assistance Sitting-balance support: Feet supported;No upper extremity supported Sitting balance-Leahy Scale: Fair       Standing balance-Leahy Scale: Poor Standing balance comment: reliant on UEs                             Pertinent Vitals/Pain Pain Assessment: Faces Faces Pain Scale: Hurts little more Pain Location: back Pain Descriptors / Indicators: Sore;Discomfort Pain Intervention(s): Premedicated before session;Monitored during session    Home Living Family/patient expects to be discharged to:: Private residence Living Arrangements: Spouse/significant other;Other (Comment) Available Help at Discharge: Family Type of Home: House Home Access: Stairs to enter   [employer] of Steps: 4 Home Layout: One level Home Equipment: Walker - 2 wheels;Hospital bed      Prior Function Level of Independence: Independent               Hand Dominance        Extremity/Trunk Assessment   Upper Extremity Assessment Upper Extremity Assessment: Defer to OT evaluation    Lower Extremity Assessment Lower Extremity Assessment: Generalized weakness       Communication   Communication: No difficulties  Cognition Arousal/Alertness: Awake/alert Behavior During Therapy: WFL for tasks assessed/performed Overall Cognitive Status: Within Functional Limits for tasks assessed                                        General Comments  Exercises     Assessment/Plan    PT Assessment Patient needs continued PT services  PT Problem List Decreased strength;Decreased activity tolerance;Decreased mobility;Pain;Decreased knowledge of precautions       PT Treatment Interventions DME instruction;Functional mobility training;Therapeutic activities;Therapeutic exercise;Patient/family education;Stair training;Gait training    PT Goals (Current goals can be found in the Care Plan section)  Acute  Rehab PT Goals Patient Stated Goal: less pain, be able to walk PT Goal Formulation: With patient Time For Goal Achievement: 06/20/18 Potential to Achieve Goals: Good    Frequency 7X/week   Barriers to discharge        Co-evaluation               AM-PAC PT "6 Clicks" Daily Activity  Outcome Measure Difficulty turning over in bed (including adjusting bedclothes, sheets and blankets)?: Unable Difficulty moving from lying on back to sitting on the side of the bed? : Unable Difficulty sitting down on and standing up from a chair with arms (e.g., wheelchair, bedside commode, etc,.)?: Unable Help needed moving to and from a bed to chair (including a wheelchair)?: A Little Help needed walking in hospital room?: A Little Help needed climbing 3-5 steps with a railing? : A Lot 6 Click Score: 11    End of Session Equipment Utilized During Treatment: Gait belt Activity Tolerance: Patient tolerated treatment well Patient left: in bed;with call bell/phone within reach;with bed alarm set;with family/visitor present   PT Visit Diagnosis: Difficulty in walking, not elsewhere classified (R26.2);Pain    Time: 1202-1217 PT Time Calculation (min) (ACUTE ONLY): 15 min   Charges:   PT Evaluation $PT Eval Low Complexity: 1 Low          Drucilla Chaletara Tymeshia Awan, PT Pager: [phone] 06/13/2018   [facility] Cen, IncWILLIAMS,Renelle Stegenga 06/13/2018, 12:41 PM

## 2018-06-13 NOTE — Progress Notes (Signed)
Subjective: 1 Day Post-Op Procedure(s) (LRB): Central decompression lumbar laminectomy L4-5 (N/A) Patient reports pain as 3 on 0-10 scale. Her leg pain is now absent. Her surgery ended late yesrerday,so I will keep her until tomorrow until she is stable and has help at home. Case was discussed with her Grandaughter.   Objective: Vital signs in last 24 hours: Temp:  [97.3 F (36.3 C)-98.3 F (36.8 C)] 98 F (36.7 C) (07/27 0654) Pulse Rate:  [67-107] 69 (07/27 0654) Resp:  [12-20] 16 (07/27 0654) BP: (127-178)/(63-90) 127/63 (07/27 0654) SpO2:  [89 %-100 %] 89 % (07/27 0654) Weight:  [66.2 kg (146 lb)] 66.2 kg (146 lb) (07/26 1124)  Intake/Output from previous day: 07/26 0701 - 07/27 0700 In: 3380 [P.O.:480; I.V.:2900] Out: 2425 [Urine:2325; Blood:100] Intake/Output this shift: No intake/output data recorded.  No results for input(s): HGB in the last 72 hours. No results for input(s): WBC, RBC, HCT, PLT in the last 72 hours. No results for input(s): NA, K, CL, CO2, BUN, CREATININE, GLUCOSE, CALCIUM in the last 72 hours. No results for input(s): LABPT, INR in the last 72 hours.  Dorsiflexion/Plantar flexion intact  Will DC tomorrow.  Assessment/Plan: 1 Day Post-Op Procedure(s) (LRB): Central decompression lumbar laminectomy L4-5 (N/A) Up with therapy    Melissa Herring 06/13/2018, 7:20 AM

## 2018-06-13 NOTE — Plan of Care (Signed)
Plan of care discussed.   

## 2018-06-14 LAB — GLUCOSE, CAPILLARY: Glucose-Capillary: 99 mg/dL (ref 70–99)

## 2018-06-14 NOTE — Progress Notes (Addendum)
Md notified of RN concern for orthostatic hypotension. Pt blood pressure dropped to sbp of 98 after physical therapy worked with her. She stated she felt drunk and had double vision (she also told rn this was not new and happened at home). She also stated she took half of her recommended dose of Clonidine (she only takes 0.1mg  at home). Physical therapy got pt to the chair. Rn encouraged her to drink plenty of fluids and elevated her feet. She is now resting but still feels weak and tired overall. Repeat blood pressure showed some improvement in sbp. Md to hold d/c until tomorrow and rn discontinued Clonidine per md order. RN will continue to monitor pt overall status. Pt  Family (granddaughter updated on plan of care as well).

## 2018-06-14 NOTE — Progress Notes (Signed)
Subjective: 2 Days Post-Op Procedure(s) (LRB): Central decompression lumbar laminectomy L4-5 (N/A) Patient reports pain as 1 on 0-10 scale. Constipated but otherwise she is doing very well.   Objective: Vital signs in last 24 hours: Temp:  [97.6 F (36.4 C)-98.3 F (36.8 C)] 98.3 F (36.8 C) (07/28 0511) Pulse Rate:  [65-71] 69 (07/28 0511) Resp:  [14-18] 16 (07/28 0511) BP: (125-139)/(60-70) 137/64 (07/28 0511) SpO2:  [90 %-98 %] 93 % (07/28 0511)  Intake/Output from previous day: 07/27 0701 - 07/28 0700 In: 680 [P.O.:480; IV Piggyback:200] Out: 1200 [Urine:1200] Intake/Output this shift: No intake/output data recorded.  No results for input(s): HGB in the last 72 hours. No results for input(s): WBC, RBC, HCT, PLT in the last 72 hours. No results for input(s): NA, K, CL, CO2, BUN, CREATININE, GLUCOSE, CALCIUM in the last 72 hours. No results for input(s): LABPT, INR in the last 72 hours.  Neurologically intact    Assessment/Plan: 2 Days Post-Op Procedure(s) (LRB): Central decompression lumbar laminectomy L4-5 (N/A) Discharge home after she ambulates.    Ranee Gosselinonald Loyalty Brashier 06/14/2018, 8:11 AM

## 2018-06-14 NOTE — Progress Notes (Signed)
Occupational Therapy Treatment Patient Details Name: Melissa Herring MRN: [id number] DOB: [birth year] Today's Date: 06/14/2018    History of present illness 84 yo female s/p Central decompression lumbar laminectomy; WU:JWJX, diverticulitis    OT comments  Pt with increased I this day.  Family will A as needed  Follow Up Recommendations  Supervision/Assistance - 24 hour;No OT follow up    Equipment Recommendations  3 in 1 bedside commode    Recommendations for Other Services      Precautions / Restrictions Precautions Precautions: Fall;Back Restrictions Weight Bearing Restrictions: (wbat)       Mobility Bed Mobility               General bed mobility comments: pt in chair  Transfers Overall transfer level: Needs assistance Equipment used: Rolling walker (2 wheeled) Transfers: Sit to/from [employer] Sit to Stand: Min guard Stand pivot transfers: Min guard       General transfer comment: assist to rise and stabilize, cues for back precautions, avoiding bending    Balance Overall balance assessment: Needs assistance Sitting-balance support: Feet supported;No upper extremity supported Sitting balance-Leahy Scale: Fair       Standing balance-Leahy Scale: Poor Standing balance comment: reliant on UEs                           ADL either performed or assessed with clinical judgement   ADL Overall ADL's : Needs assistance/impaired     Grooming: Standing;Wash/dry face;Wash/dry hands;Supervision/safety           Upper Body Dressing : Set up;Sitting   Lower Body Dressing: Moderate assistance;Sit to/from stand;Cueing for sequencing;Cueing for safety;Cueing for compensatory techniques;Cueing for back precautions;With adaptive equipment   Toilet Transfer: Minimal assistance;Ambulation;RW;Comfort height toilet   Toileting- Clothing Manipulation and Hygiene: Minimal assistance;Sit to/from stand;Cueing for sequencing;Cueing for  safety       Functional mobility during ADLs: Min guard;Rolling walker General ADL Comments: Ae education provided     Vision Patient Visual Report: No change from baseline            Cognition Arousal/Alertness: Awake/alert Behavior During Therapy: WFL for tasks assessed/performed Overall Cognitive Status: Within Functional Limits for tasks assessed                                                     Pertinent Vitals/ Pain       Pain Score: 3  Pain Location: back Pain Descriptors / Indicators: Aching;Discomfort Pain Intervention(s): Monitored during session;Limited activity within patient's tolerance         Frequency  Min 2X/week        Progress Toward Goals  OT Goals(current goals can now be found in the care plan section)  Progress towards OT goals: Progressing toward goals     Plan         AM-PAC PT "6 Clicks" Daily Activity     Outcome Measure   Help from another person eating meals?: None Help from another person taking care of personal grooming?: A Little Help from another person toileting, which includes using toliet, bedpan, or urinal?: A Little Help from another person bathing (including washing, rinsing, drying)?: A Little Help from another person to put on and taking off regular upper body clothing?: A Little Help from another person to  put on and taking off regular lower body clothing?: A Little 6 Click Score: 19    End of Session Equipment Utilized During Treatment: Rolling walker  OT Visit Diagnosis: Unsteadiness on feet (R26.81);Muscle weakness (generalized) (M62.81);Pain   Activity Tolerance Patient tolerated treatment well   Patient Left in bed   Nurse Communication Mobility status        Time: [id number] OT Time Calculation (min): 28 min  Charges: OT General Charges $OT Visit: 1 Visit OT Treatments $Self Care/Home Management : 23-37 mins  [place]icks[place]ori Assad [place], [place] [phone]   Alba CoryREDDING, Lalia Loudon  D 06/14/2018, 9:33 AM

## 2018-06-14 NOTE — Plan of Care (Signed)
Pt stable this am. Pt to be discharged home today.

## 2018-06-14 NOTE — Progress Notes (Signed)
Physical Therapy Treatment Patient Details Name: Melissa Herring MRN: [id number] DOB: August 07, 1931 Today's Date: 06/14/2018    History of Present Illness 84 yo female s/p Central decompression L4-5 lumbar laminectomy; WU:JWJX, diverticulitis     PT Comments    Pt with c/o of some blurry vision, no dizziness--BP 98/58 after mobility, RN aware; recommend assist/supervision at home for safety with mobility; pt would benefit from HHPT   Follow Up Recommendations  Follow surgeon's recommendation for DC plan and follow-up therapies(needs HHPT)     Equipment Recommendations  None recommended by PT    Recommendations for Other Services       Precautions / Restrictions Precautions Precautions: Fall;Back Precaution Booklet Issued: Yes (comment) Precaution Comments: reviewed back precautions Restrictions Weight Bearing Restrictions: No    Mobility  Bed Mobility               General bed mobility comments: pt in chair  Transfers Overall transfer level: Needs assistance Equipment used: Rolling walker (2 wheeled) Transfers: Sit to/from Stand Sit to Stand: Mod assist Stand pivot transfers: Min guard       General transfer comment: incr time and assist to rise and stabilize, much diofficulty achieving knee and hip extension; cues for back precautions, avoiding bending  Ambulation/Gait Ambulation/Gait assistance: Min assist Gait Distance (Feet): 30 Feet Assistive device: Rolling walker (2 wheeled) Gait Pattern/deviations: Step-to pattern;Decreased step length - left;Decreased stance time - right     General Gait Details: cues for posture and RW position; assist to maneuver RW   Stairs Stairs: Yes   Stair Management: One rail Right;Forwards;Sideways;Step to pattern Number of Stairs: 3 General stair comments: cues for sequence   Wheelchair Mobility    Modified Rankin (Stroke Patients Only)       Balance Overall balance assessment: Needs  assistance Sitting-balance support: Feet supported;No upper extremity supported Sitting balance-Leahy Scale: Fair       Standing balance-Leahy Scale: Poor Standing balance comment: reliant on UEs                            Cognition Arousal/Alertness: Awake/alert Behavior During Therapy: WFL for tasks assessed/performed Overall Cognitive Status: Within Functional Limits for tasks assessed                                        Exercises      General Comments        Pertinent Vitals/Pain Pain Assessment: 0-10 Pain Score: 3  Pain Location: back Pain Descriptors / Indicators: Aching;Discomfort Pain Intervention(s): Limited activity within patient's tolerance;Monitored during session    Home Living                      Prior Function            PT Goals (current goals can now be found in the care plan section) Acute Rehab PT Goals Patient Stated Goal: less pain, be able to walk PT Goal Formulation: With patient Time For Goal Achievement: 06/20/18 Potential to Achieve Goals: Good Progress towards PT goals: Progressing toward goals    Frequency    7X/week      PT Plan Current plan remains appropriate    Co-evaluation              AM-PAC PT "6 Clicks" Daily Activity  Outcome Measure  Difficulty turning over  in bed (including adjusting bedclothes, sheets and blankets)?: Unable Difficulty moving from lying on back to sitting on the side of the bed? : Unable Difficulty sitting down on and standing up from a chair with arms (e.g., wheelchair, bedside commode, etc,.)?: Unable Help needed moving to and from a bed to chair (including a wheelchair)?: A Lot Help needed walking in hospital room?: A Little Help needed climbing 3-5 steps with a railing? : A Little 6 Click Score: 11    End of Session Equipment Utilized During Treatment: Gait belt Activity Tolerance: Patient tolerated treatment well Patient left: in  chair;with call bell/phone within reach;with chair alarm set;with nursing/sitter in room   PT Visit Diagnosis: Difficulty in walking, not elsewhere classified (R26.2);Pain     Time: 1003-1018 PT Time Calculation (min) (ACUTE ONLY): 15 min  Charges:  $Gait Training: 8-22 mins                     Drucilla Chaletara Zandrea Kenealy, PT Pager: [phone] 06/14/2018    [facility] LLCWILLIAMS,Melissa Muela 06/14/2018, 10:29 AM

## 2018-06-15 MED ORDER — ALUM & MAG HYDROXIDE-SIMETH 200-200-20 MG/5ML PO SUSP
30.0000 mL | ORAL | Status: DC | PRN
  Filled 2018-06-15: qty 30

## 2018-06-15 NOTE — Care Management Important Message (Signed)
Important Message  Patient Details  Name: Melissa ChattersHilda C Potier MRN: [id number] Date of Birth: 11/08/1931   Medicare Important Message Given:  Yes    Caren MacadamFuller, Adalynn Corne 06/15/2018, 11:49 AMImportant Message  Patient Details  Name: Melissa ChattersHilda C Wegmann MRN: [id number] Date of Birth: 11/08/1931   Medicare Important Message Given:  Yes    Caren MacadamFuller, Livy Ross 06/15/2018, 11:49 AM

## 2018-06-15 NOTE — Progress Notes (Signed)
Subjective: 3 Days Post-Op Procedure(s) (LRB): Central decompression lumbar laminectomy L4-5 (N/A) Patient reports pain as 1 on 0-10 scale. She is so much better this morning. Will hold her BP Meds since her pressure is better today. She has had an episode of Orthostatic hypotension yesterday. Shas also had a problem with constipation but now did have a bowel movement. Will DC today. Family consulted yesterday.   Objective: Vital signs in last 24 hours: Temp:  [99.1 F (37.3 C)-99.4 F (37.4 C)] 99.1 F (37.3 C) (07/29 40980638) Pulse Rate:  [60-88] 88 (07/29 0638) Resp:  [16-18] 18 (07/29 11910638) BP: (98-148)/(52-71) 132/67 (07/29 0638) SpO2:  [93 %-95 %] 93 % (07/29 47820638)  Intake/Output from previous day: 07/28 0701 - 07/29 0700 In: 780 [P.O.:780] Out: 503 [Urine:502; Stool:1] Intake/Output this shift: No intake/output data recorded.  No results for input(s): HGB in the last 72 hours. No results for input(s): WBC, RBC, HCT, PLT in the last 72 hours. No results for input(s): NA, K, CL, CO2, BUN, CREATININE, GLUCOSE, CALCIUM in the last 72 hours. No results for input(s): LABPT, INR in the last 72 hours.  Neurovascular intact  Doing well  Assessment/Plan: 3 Days Post-Op Procedure(s) (LRB): Central decompression lumbar laminectomy L4-5 (N/A) Discharge today and she will need to see her Primary MD this week.    Ranee GosselinRonald Linden Tagliaferro 06/15/2018, 7:34 AM

## 2018-06-15 NOTE — Progress Notes (Signed)
Occupational Therapy Treatment Patient Details Name: Melissa Herring Needs MRN: [id number] DOB: 27-Jun-1931 Today's Date: 06/15/2018    History of present illness 84 yo female s/p Central decompression L4-5 lumbar laminectomy; QV:ZDGLHx:GERD, diverticulitis    OT comments  Pt feeling fatigued this am but moving well.  DC delayed yesterday/  Follow Up Recommendations  Supervision/Assistance - 24 hour;No OT follow up    Equipment Recommendations  3 in 1 bedside commode    Recommendations for Other Services      Precautions / Restrictions Precautions Precautions: Fall;Back Precaution Booklet Issued: Yes (comment) Precaution Comments: reviewed back precautions Restrictions Weight Bearing Restrictions: No       Mobility Bed Mobility Overal bed mobility: Needs Assistance Bed Mobility: Rolling;Sit to Sidelying;Sidelying to Sit Rolling: Min assist Sidelying to sit: Min assist          Transfers Overall transfer level: Needs assistance Equipment used: Rolling walker (2 wheeled) Transfers: Sit to/from [employer];Stand Pivot Transfers Sit to Stand: Min assist Stand pivot transfers: Min assist                ADL either performed or assessed with clinical judgement   ADL Overall ADL's : Needs assistance/impaired     Grooming: Standing;Wash/dry face;Wash/dry hands;Supervision/safety           Upper Body Dressing : Set up;Sitting   Lower Body Dressing: Minimal assistance;Sit to/from stand;Cueing for sequencing;Cueing for back precautions;With adaptive equipment;Cueing for safety       Toileting- Clothing Manipulation and Hygiene: Minimal assistance;Sit to/from stand;Cueing for sequencing;Cueing for safety               Vision Patient Visual Report: No change from baseline            Cognition Arousal/Alertness: Awake/alert Behavior During Therapy: WFL for tasks assessed/performed Overall Cognitive Status: Within Functional Limits for tasks assessed                                                      Pertinent Vitals/ Pain       Pain Score: 3  Faces Pain Scale: Hurts a little bit Pain Location: back Pain Descriptors / Indicators: Aching;Discomfort Pain Intervention(s): Limited activity within patient's tolerance     Prior Functioning/Environment              Frequency  Min 2X/week        Progress Toward Goals  OT Goals(current goals can now be found in the care plan section)  Progress towards OT goals: Progressing toward goals     Plan Discharge plan remains appropriate       AM-PAC PT "6 Clicks" Daily Activity     Outcome Measure   Help from another person eating meals?: None Help from another person taking care of personal grooming?: A Little Help from another person toileting, which includes using toliet, bedpan, or urinal?: A Little Help from another person bathing (including washing, rinsing, drying)?: A Little Help from another person to put on and taking off regular upper body clothing?: A Little Help from another person to put on and taking off regular lower body clothing?: A Little 6 Click Score: 19    End of Session Equipment Utilized During Treatment: Rolling walker  OT Visit Diagnosis: Unsteadiness on feet (R26.81);Muscle weakness (generalized) (M62.81);Pain   Activity Tolerance Patient tolerated treatment well  Patient Left in chair;with call bell/phone within reach;with chair alarm set   Nurse Communication Mobility status        Time: 5409-8119 OT Time Calculation (min): 12 min  Charges: OT General Charges $OT Visit: 1 Visit OT Treatments $Self Care/Home Management : 8-22 mins  [place], [place] [phone]   Einar Crow D 06/15/2018, 10:55 AM

## 2018-06-15 NOTE — Discharge Summary (Signed)
Physician Discharge Summary   Patient ID: Melissa Herring MRN: [id number] DOB/AGE: 1931/02/01 84 y.o.  Admit date: 06/12/2018 Discharge date: 06/15/2018  Primary Diagnosis:  Lumbar spinal stenosis  Admission Diagnoses:  Past Medical History:  Diagnosis Date  . Diverticulitis   . GERD (gastroesophageal reflux disease)    Discharge Diagnoses:   Active Problems:   Spinal stenosis, lumbar region with neurogenic claudication  Procedure:  Procedure(s) (LRB): Central decompression lumbar laminectomy L4-5 (N/A)   Consults: None  HPI:  Back Pain Patient presents for evaluation of low back problems. Symptoms have been present for several months and include pain in low back and both legs (aching and shooting in character; 8/10 in severity). Initial inciting event: none. Symptoms are worse with activity. Alleviating factors identifiable by the patient are recumbency. Aggravating factors identifiable by the patient are standing and walking. Treatments initiated by the patient: rest, ice, heat, and OTC meds. Previous lower back problems: scoliosis. Previous work up: plain x-rays and CT myelogram. Previous treatments: activity modification and oral corticosteroids.     Laboratory Data: Hospital Outpatient Visit on 06/08/2018  Component Date Value Ref Range Status  . ABO/RH(D) 06/08/2018    Final                   Value:A POS Performed at [facility], [address] [place], [place] [address]    No results for input(s): HGB in the last 72 hours. No results for input(s): WBC, RBC, HCT, PLT in the last 72 hours. No results for input(s): NA, K, CL, CO2, BUN, CREATININE, GLUCOSE, CALCIUM in the last 72 hours. No results for input(s): LABPT, INR in the last 72 hours.  X-Rays:Dg Lumbar Spine 2-3 Views  Result Date: 06/08/2018 CLINICAL DATA:  Preoperative evaluation for upcoming lumbar surgery EXAM: LUMBAR SPINE - 2 VIEW COMPARISON:  03/26/2018 FINDINGS: Five lumbar type  vertebral bodies are well visualized. Scoliosis is again identified concave to the left centered at approximately the L1-2 interspace. This is similar to that seen on prior exams. Facet hypertrophic changes and osteophytic changes are noted. No compression deformities are seen. Diffuse aortic calcifications are noted. IMPRESSION: Degenerative changes similar to that seen on recent CT myelogram. Numbering nomenclature is similar to that utilized on prior CT myelogram as well as prior MRI. Electronically Signed   By: Alcide Clever M.D.   On: 06/08/2018 16:22   Dg Spine Portable 1 View  Result Date: 06/12/2018 CLINICAL DATA:  Elective lumbar surgery. EXAM: PORTABLE SPINE - 1 VIEW COMPARISON:  Radiograph same day. FINDINGS: Single intraoperative cross-table lateral projection of the lumbar spine demonstrates surgical probe directed toward posterior elements of L4. IMPRESSION: Surgical localization as described above. Electronically Signed   By: Lupita Raider, M.D.   On: 06/12/2018 15:26   Dg Spine Portable 1 View  Result Date: 06/12/2018 CLINICAL DATA:  Intraoperative lumbar localization EXAM: PORTABLE SPINE - 1 VIEW COMPARISON:  Film from earlier in the same day. FINDINGS: Surgical retractors and instruments are noted with the surgical instrument at the L3-4 interspace. The numbering nomenclature is similar to that utilized on the prior examination. No gross soft tissue is seen. IMPRESSION: Intraoperative localization at L3-4. Electronically Signed   By: Alcide Clever M.D.   On: 06/12/2018 15:21   Dg Spine Portable 1 View  Result Date: 06/12/2018 CLINICAL DATA:  84 year old female undergoing lumbar spine surgery. EXAM: PORTABLE SPINE - 1 VIEW COMPARISON:  Prior intraoperative radiographs obtained earlier today FINDINGS: Soft tissue  spreaders noted posteriorly. A metallic probe overlies the lamina at L4. Multilevel degenerative disc disease and facet arthropathy. IMPRESSION: Intraoperative localization  radiographs as above. Electronically Signed   By: Malachy MoanHeath  McCullough M.D.   On: 06/12/2018 15:11   Dg Spine Portable 1 View  Result Date: 06/12/2018 CLINICAL DATA:  84 y/o female. Lumbar surgery. Pt on table. Please number vertebra for surgeon. EXAM: PORTABLE SPINE - 1 VIEW COMPARISON:  Earlier film of the same day. Numbering scheme follows that used on CT myelogram 03/26/2018 and MR 07/19/2016. FINDINGS: Metallic instruments from posterior approach, tips projecting over L3 and L4 spinous processes. L5 is a transitional segment. IMPRESSION: Intraoperative localization Electronically Signed   By: Corlis Leak  Hassell M.D.   On: 06/12/2018 14:58   Dg Spine Portable 1 View  Result Date: 06/12/2018 CLINICAL DATA:  Surgical localization. EXAM: PORTABLE SPINE - 1 VIEW COMPARISON:  Radiographs of June 08, 2018. FINDINGS: Single intraoperative cross-table lateral projection of the lumbar spine demonstrates a surgical probe directed toward the posterior spinous process of L3. IMPRESSION: Surgical localization as described above. Electronically Signed   By: Lupita RaiderJames  Green Jr, M.D.   On: 06/12/2018 14:54   Dg Spine Portable 1 View  Result Date: 06/12/2018 CLINICAL DATA:  Surgical localization imaging for lumbar surgery. EXAM: PORTABLE SPINE - 1 VIEW COMPARISON:  06/08/2018 FINDINGS: Two surgical needles have been inserted posteriorly. The more superior lies 7 cm posterior to the posterior upper aspect of the L4 vertebra. More inferior lies 6.5 cm posterior to the posterior upper margin of the L5 vertebra. IMPRESSION: Surgical localization imaging as described. Electronically Signed   By: Amie Portlandavid  Ormond M.D.   On: 06/12/2018 14:53    EKG: Orders placed or performed in visit on 10/11/14  . EKG 12-Lead     Hospital Course: Patient [facility] and taken to the OR and underwent the above state procedure without complications.  Patient tolerated the procedure well and was later transferred to the  recovery room and then to the orthopaedic floor for postoperative care.  They were given PO and IV analgesics for pain control following their surgery.  They were given 24 hours of postoperative antibiotics.   PT was consulted postop to assist with mobility and transfers.  The patient was allowed to be WBAT with therapy and was taught back precautions. Discharge planning was consulted to help with postop disposition and equipment needs.  Patient had a fair night on the evening of surgery and started to get up OOB with therapy on day one. She progressed slowly due to fatigue, pain, and hypotension. BP meds were held post op day 2 into day 3. Patient was seen post op day 3 and had improved.  They were given discharge instructions and dressing directions.  They were instructed on when to follow up in the office with Dr. Darrelyn HillockGioffre and to follow up with the PCP regarding BP management.    Diet: Cardiac diet Activity:WBAT Follow-up:in 10 days Disposition - Home Discharged Condition: stable   Discharge Instructions    Call MD / Call 911   Complete by:  As directed    If you experience chest pain or shortness of breath, CALL 911 and be transported to the hospital emergency room.  If you develope a fever above 101 F, pus (white drainage) or increased drainage or redness at the wound, or calf pain, call your surgeon's office.   Constipation Prevention   Complete by:  As directed  Drink plenty of fluids.  Prune juice may be helpful.  You may use a stool softener, such as Colace (over the counter) 100 mg twice a day.  Use MiraLax (over the counter) for constipation as needed.   Diet - low sodium heart healthy   Complete by:  As directed    Discharge instructions   Complete by:  As directed    For the first three days, remove your dressing, and tape a piece of saran wrap over your incision Take your shower, then remove the saran wrap and put a clean dressing on. After three days you can shower without the  saran wrap.  No lifting or excessive bending No driving while taking pain medications Call Dr. Darrelyn Hillock if any wound complications or temperature of 101 degrees F or over.  Call the office for an appointment to see Dr. Darrelyn Hillock in two weeks: [phone] and ask for Dr. Jeannetta Ellis nurse, Mackey Birchwood.   Incentive spirometry RT   Complete by:  As directed    Increase activity slowly as tolerated   Complete by:  As directed      Allergies as of 06/15/2018      Reactions   Penicillins Hives   Has patient had a PCN reaction causing immediate rash, facial/tongue/throat swelling, SOB or lightheadedness with hypotension: No Has patient had a PCN reaction causing severe rash involving mucus membranes or skin necrosis: Yes Has patient had a PCN reaction that required hospitalization: Yes Has patient had a PCN reaction occurring within the last 10 years: No If all of the above answers are "NO", then may proceed with Cephalosporin use.   Sulfa Antibiotics Rash      Medication List    STOP taking these medications   cloNIDine 0.1 mg/24hr patch Commonly known as:  CATAPRES - Dosed in mg/24 hr   cloNIDine 0.2 MG tablet Commonly known as:  CATAPRES     TAKE these medications   alendronate 70 MG tablet Commonly known as:  FOSAMAX Take 70 mg by mouth every Friday. Take with a full glass of water on an empty stomach.   aspirin EC 81 MG tablet Take 81 mg by mouth daily.   atorvastatin 10 MG tablet Commonly known as:  LIPITOR Take 10 mg by mouth at bedtime.   CoQ-10 400 MG Caps Take 400 mg by mouth daily.   esomeprazole 20 MG capsule Commonly known as:  NEXIUM Take 20 mg by mouth 2 (two) times daily.   esomeprazole 40 MG capsule Commonly known as:  NEXIUM Take 30- 60 min before your first and last meals of the day   gabapentin 600 MG tablet Commonly known as:  NEURONTIN Take 600 mg by mouth See admin instructions. Take 600 mg by mouth 4 times daily may take a fifth 600 mg dose as  needed pain   HYDROcodone-acetaminophen 5-325 MG tablet Commonly known as:  NORCO/VICODIN Take 1 tablet by mouth every 4 (four) hours as needed for moderate pain ((score 4 to 6)).   methocarbamol 500 MG tablet Commonly known as:  ROBAXIN Take 1 tablet (500 mg total) by mouth every 6 (six) hours as needed for muscle spasms.   VISINE OP Place 1 drop into both eyes daily as needed (dry eyes).   vitamin A 8000 UNIT capsule Take 8,000 Units by mouth daily.   vitamin B-12 1000 MCG tablet Commonly known as:  CYANOCOBALAMIN Take 1,000 mcg by mouth daily.   Vitamin D 2000 units tablet Take 2,000 Units by  mouth daily.   vitamin E 400 UNIT capsule Take 400 Units by mouth daily.      Follow-up Information    Ranee Gosselin, MD. Schedule an appointment as soon as possible for a visit on 06/23/2018.   Specialty:  Orthopedic Surgery Contact information: [address] [place] [address] [phone]        Gordan Payment., MD. Schedule an appointment as soon as possible for a visit.   Specialty:  Internal Medicine Why:  Make an appointment this week to see PCP this week about blood pressure management Contact information: [address] [place] [address] [phone]           Signed: Dimitri Ped, PA-C Orthopaedic Surgery 06/15/2018, 2:48 PM

## 2018-06-15 NOTE — Progress Notes (Signed)
Physical Therapy Treatment Patient Details Name: Melissa ChattersHilda C Herring MRN: [id number] DOB: 15-Mar-1931 Today's Date: 06/15/2018    History of Present Illness 84 yo female s/p Central decompression L4-5 lumbar laminectomy; WU:JWJXHx:GERD, diverticulitis     PT Comments    Pt progressing toward goals, incr gait tolerance today, no LOB, cues for back precautions   Follow Up Recommendations  Follow surgeon's recommendation for DC plan and follow-up therapies     Equipment Recommendations  None recommended by PT    Recommendations for Other Services       Precautions / Restrictions Precautions Precautions: Fall;Back Precaution Booklet Issued: Yes (comment) Precaution Comments: reviewed back precautions Restrictions Weight Bearing Restrictions: No    Mobility  Bed Mobility Overal bed mobility: Needs Assistance Bed Mobility: Rolling;Sit to Sidelying;Sidelying to Sit Rolling: Min assist Sidelying to sit: Min assist       General bed mobility comments: pt in chair  Transfers Overall transfer level: Needs assistance Equipment used: Rolling walker (2 wheeled) Transfers: Sit to/from Stand Sit to Stand: Supervision Stand pivot transfers: Min assist       General transfer comment: cues for hand placement and posture  Ambulation/Gait Ambulation/Gait assistance: Min guard;Supervision Gait Distance (Feet): 160 Feet Assistive device: Rolling walker (2 wheeled) Gait Pattern/deviations: Step-through pattern;Decreased stride length     General Gait Details: cues for trunk extension   Stairs             Wheelchair Mobility    Modified Rankin (Stroke Patients Only)       Balance             Standing balance-Leahy Scale: Fair                              Cognition Arousal/Alertness: Awake/alert Behavior During Therapy: WFL for tasks assessed/performed Overall Cognitive Status: Within Functional Limits for tasks assessed                                        Exercises      General Comments        Pertinent Vitals/Pain Pain Assessment: 0-10 Pain Score: 2  Faces Pain Scale: Hurts a little bit Pain Location: back Pain Descriptors / Indicators: Aching;Discomfort Pain Intervention(s): Limited activity within patient's tolerance;Monitored during session    Home Living                      Prior Function            PT Goals (current goals can now be found in the care plan section) Acute Rehab PT Goals Patient Stated Goal: less pain, be able to walk PT Goal Formulation: With patient Time For Goal Achievement: 06/20/18 Potential to Achieve Goals: Good Progress towards PT goals: Progressing toward goals    Frequency    7X/week      PT Plan Current plan remains appropriate    Co-evaluation              AM-PAC PT "6 Clicks" Daily Activity  Outcome Measure  Difficulty turning over in bed (including adjusting bedclothes, sheets and blankets)?: A Lot Difficulty moving from lying on back to sitting on the side of the bed? : A Little Difficulty sitting down on and standing up from a chair with arms (e.g., wheelchair, bedside commode, etc,.)?: A Little Help needed moving  to and from a bed to chair (including a wheelchair)?: A Little Help needed walking in hospital room?: A Little Help needed climbing 3-5 steps with a railing? : A Little 6 Click Score: 17    End of Session Equipment Utilized During Treatment: Gait belt Activity Tolerance: Patient tolerated treatment well Patient left: in chair;with call bell/phone within reach;with chair alarm set   PT Visit Diagnosis: Difficulty in walking, not elsewhere classified (R26.2);Pain     Time: 1120-1136 PT Time Calculation (min) (ACUTE ONLY): 16 min  Charges:  $Gait Training: 8-22 mins $Therapeutic Activity: 38-52 mins                     Drucilla Chalet, PT Pager: [phone] 06/15/2018    [facility] 06/15/2018, 11:42 AM

## 2018-07-09 ENCOUNTER — Ambulatory Visit: Payer: Medicare Other | Admitting: Cardiovascular Disease

## 2018-09-11 ENCOUNTER — Telehealth: Payer: Self-pay | Admitting: *Deleted

## 2018-09-11 DIAGNOSIS — R911 Solitary pulmonary nodule: Secondary | ICD-10-CM

## 2018-09-11 DIAGNOSIS — R918 Other nonspecific abnormal finding of lung field: Secondary | ICD-10-CM

## 2018-09-11 NOTE — Telephone Encounter (Signed)
-----   Message from Nyoka Cowden, MD sent at 05/27/2018  5:46 AM EDT ----- Ct s contrast due f/u mpns

## 2018-09-11 NOTE — Telephone Encounter (Signed)
LMTCB

## 2018-09-14 NOTE — Telephone Encounter (Signed)
Spoke with the pt and reminded her that she is due for CT Chest 10/20/18  She verbalized understanding  Order sent to [facility]

## 2018-10-22 ENCOUNTER — Inpatient Hospital Stay: Admission: RE | Admit: 2018-10-22 | Payer: Self-pay | Source: Ambulatory Visit

## 2018-10-30 ENCOUNTER — Inpatient Hospital Stay: Admission: RE | Admit: 2018-10-30 | Payer: Self-pay | Source: Ambulatory Visit

## 2018-11-19 ENCOUNTER — Ambulatory Visit (INDEPENDENT_AMBULATORY_CARE_PROVIDER_SITE_OTHER)
Admission: RE | Admit: 2018-11-19 | Discharge: 2018-11-19 | Disposition: A | Discharge status: Home / Self Care | Payer: Medicare Other | Source: Ambulatory Visit | Attending: Internal Medicine | Admitting: Internal Medicine

## 2018-11-19 DIAGNOSIS — R918 Other nonspecific abnormal finding of lung field: Secondary | ICD-10-CM

## 2018-11-19 DIAGNOSIS — R911 Solitary pulmonary nodule: Secondary | ICD-10-CM

## 2018-11-20 ENCOUNTER — Inpatient Hospital Stay: Admission: RE | Admit: 2018-11-20 | Payer: Self-pay | Source: Ambulatory Visit

## 2018-11-20 NOTE — Progress Notes (Signed)
Spoke with pt and notified of results per Dr. Sherene SiresWert. Pt verbalized understanding and denied any questions. Appt scheduled and new office location info given

## 2019-05-24 ENCOUNTER — Ambulatory Visit (INDEPENDENT_AMBULATORY_CARE_PROVIDER_SITE_OTHER): Payer: Medicare Other | Admitting: Internal Medicine

## 2019-05-24 ENCOUNTER — Encounter: Payer: Self-pay | Admitting: Internal Medicine

## 2019-05-24 ENCOUNTER — Other Ambulatory Visit: Payer: Self-pay

## 2019-05-24 ENCOUNTER — Ambulatory Visit (INDEPENDENT_AMBULATORY_CARE_PROVIDER_SITE_OTHER): Payer: Medicare Other

## 2019-05-24 DIAGNOSIS — R918 Other nonspecific abnormal finding of lung field: Secondary | ICD-10-CM | POA: Diagnosis not present

## 2019-05-24 DIAGNOSIS — K219 Gastro-esophageal reflux disease without esophagitis: Secondary | ICD-10-CM

## 2019-05-24 NOTE — Patient Instructions (Signed)
Please remember to go to the  x-ray department  for your tests - we will call you with the results when they are available      If you are satisfied with your treatment plan,  let your doctor know and he/she can either refill your medications or you can return here when your prescription runs out.     If in any way you are not 100% satisfied,  please tell us.  If 100% better, tell your friends!  Pulmonary follow up is as needed   

## 2019-05-24 NOTE — Progress Notes (Signed)
LMTCB

## 2019-05-24 NOTE — Progress Notes (Signed)
Melissa Herring, female    DOB: January 11, 1931,    MRN: [id number]     Brief patient profile:  13 yowf never smoker with chronic dry cough esp if flat on  Her back x around 2016 much better since stopped ACEi around 1st week in July 2019 referred to pulmonary clinic 05/26/2018 by Dr   Gladstone Lighter for preop eval. Previously carried dx of bronchiectasis with chronic resp distress per PCP notes and ? chf with cards w/u preop showing nl echo6/14/19 showing mild lvh, nl LA     05/26/2018 New pt eval/ Melissa Herring   Cc : I can't live with this back pain, esp on R shooting all the way to my foot"   Chief Complaint  Patient presents with  . Pulmonary Consult    Referred by Dr. Gladstone Lighter for pulmonary clearance for spinal surgery. She only c/o occ non prod cough at night.   Dyspnea:  Limited by back not breathing  Cough: much better since stopped acei  Sleep: better one pillow and arm or in recliner due to sensation of noct acid reflux  PPi in am only assoc with throat clearing but really not much cough. rec  Proceed with surgery Place the second patch of clonidine on and when it's expired  go ahead and start clonidine 0.2 mg twice  Daily  Take nexium Take 30- 60 min before your first and last meals of the day  GERD discussed    05/24/2019  f/u ov/Essa Malachi re:  MAI  Chief Complaint  Patient presents with  . Follow-up    Patient she has been doing anything. Rarely uses albuterol.  Dyspnea:  Not limited by breathing from desired activities  / back/R hip limiting  Cough: none  Sleeping: back in bed but raised slt at head  SABA use: maybe once a month " not sure why" or whether it really helps 02: none Takes ppi each am and if misses dose has hb on otc nexium   No obvious day to day or daytime variability or assoc excess/ purulent sputum or mucus plugs or hemoptysis or cp or chest tightness, subjective wheeze or overt sinus  symptoms.   Sleeping above  without nocturnal  or early am exacerbation  of respiratory   c/o's or need for noct saba. Also denies any obvious fluctuation of symptoms with weather or environmental changes or other aggravating or alleviating factors except as outlined above   No unusual exposure hx or h/o childhood pna/ asthma or knowledge of premature birth.  Current Allergies, Complete Past Medical History, Past Surgical History, Family History, and Social History were reviewed in [employer] record.  ROS  The following are not active complaints unless bolded Hoarseness, sore throat, dysphagia, dental problems, itching, sneezing,  nasal congestion or discharge of excess mucus or purulent secretions, ear ache,   fever, chills, sweats, unintended wt loss or wt gain, classically pleuritic or exertional cp,  orthopnea pnd or arm/hand swelling  or leg swelling, presyncope, palpitations, abdominal pain, anorexia, nausea, vomiting, diarrhea  or change in bowel habits or change in bladder habits, change in stools or change in urine, dysuria, hematuria,  rash, arthralgias, visual complaints, headache, numbness, weakness or ataxia or problems with walking or coordination,  change in mood or  memory.        Current Meds  Medication Sig  . albuterol (VENTOLIN HFA) 108 (90 Base) MCG/ACT inhaler Inhale 2 puffs into the lungs as needed.  . Cholecalciferol (  VITAMIN D) 2000 units tablet Take 2,000 Units by mouth daily.  . Coenzyme Q10 (COQ-10) 400 MG CAPS Take 400 mg by mouth daily.  Marland Kitchen. esomeprazole (NEXIUM) 40 MG capsule Take 30- 60 min before your first and last meals of the day  . hydrochlorothiazide (HYDRODIURIL) 25 MG tablet Take 1 tablet by mouth daily.  . Tetrahydrozoline HCl (VISINE OP) Place 1 drop into both eyes daily as needed (dry eyes).  . vitamin A 8000 UNIT capsule Take 8,000 Units by mouth daily.  . vitamin B-12 (CYANOCOBALAMIN) 1000 MCG tablet Take 1,000 mcg by mouth daily.  . vitamin E 400 UNIT capsule Take 400 Units by mouth daily.             Objective:      Pleasant amb wf nad   Wt Readings from Last 3 Encounters:  05/24/19 137 lb 6.4 oz (62.3 kg)  06/12/18 146 lb (66.2 kg)  06/08/18 146 lb 8 oz (66.5 kg)     Vital signs reviewed - Note on arrival 02 sats  95% on RA   . HEENT: nl dentition / oropharynx. Nl external ear canals without cough reflex -  Mild bilateral non-specific turbinate edema     NECK :  without JVD/Nodes/TM/ nl carotid upstrokes bilaterally   LUNGS: no acc muscle use,  Min barrel  contour chest wall with bilateral  slightly decreased bs s audible wheeze and  without cough on insp or exp maneuver and min  Hyperresonant  to  percussion bilaterally     CV:  RRR  no s3 or murmur or increase in P2, and no edema   ABD:  soft and nontender with pos end  insp Hoover's  in the supine position. No bruits or organomegaly appreciated, bowel sounds nl  MS:   Nl gait/  ext warm without deformities, calf tenderness, cyanosis or clubbing No obvious joint restrictions   SKIN: warm and dry without lesions    NEURO:  alert, approp, nl sensorium with  no motor or cerebellar deficits apparent.         CXR PA and Lateral:   05/24/2019 :    I personally reviewed images and agree with radiology impression as follows:       1. No acute cardiopulmonary disease. Stable mild medial right lung base bronchiectasis. No radiographically apparent pulmonary nodularity. 2. Moderate hiatal hernia.   Also 05/15/12  DgEs Moderate HH with GERD    Assessment

## 2019-05-25 ENCOUNTER — Encounter: Payer: Self-pay | Admitting: Internal Medicine

## 2019-05-25 NOTE — Assessment & Plan Note (Signed)
See scan 04/20/18 none over 6 mm in never smoker   - CT chest 11/19/2018 Fluctuating tree-in-bud nodularity in the right lung, slightly improved in the right lower lobe, but new in the right middle lobe. 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 Subpleural nodularity in both lower lobes has improved. No suspicious nodules >  cxr ok 05/24/2019   This is an extremely common benign condition in the elderly and does not warrant aggressive eval/ rx at this point unless there is a clinical correlation suggesting unaddressed pulmonary infection (purulent sputum, night sweats, unintended wt loss, doe) or evolution of  obvious changes on plain cxr (as opposed to serial CT, which is way over sensitive to make clinical decisions re intervention and treatment in the elderly, who tend to tolerate both dx and treatment poorly) .   She is clinically doing so well that not only does she not need further directed cxr's but also can just f/u here prn flare of resp symptoms  Each maintenance medication was reviewed in detail including most importantly the difference between maintenance and as needed and under what circumstances the prns are to be used.  Please see AVS for specific  Instructions which are unique to this visit and I personally typed out  which were reviewed in detail in writing with the patient and a copy provided.

## 2019-05-25 NOTE — Assessment & Plan Note (Signed)
Also 05/15/12  DgEs :  Moderate HH with GERD  Advised on impt of using ppi ac and its possible role in perpetuating the cycle of chronic cough evolving is some cases to more chronic lung dz like bronchiectasis, to which she is at risk   > 50% of this 25 min summary ov spent on counseling.

## 2019-05-28 IMAGING — CT CT CHEST W/O CM
2 of 3 series · 15 of 36 positions shown, 18 images · non-contrast
Comparison: Chest CT 04/20/2018 and 11/21/2015.

CLINICAL DATA: Follow up pulmonary nodules. No history of
malignancy. Patient reports cough, but no shortness of breath.

EXAM:
CT CHEST WITHOUT CONTRAST
TECHNIQUE: Multidetector CT imaging of the chest was performed following the
standard protocol without IV contrast.

[Series 2: thorax · axial · 0.66mm/px · z∈[+1300,+1560]mm · 12 of 154 slices shown, 15 images]
[im 12/154  mediastinal]
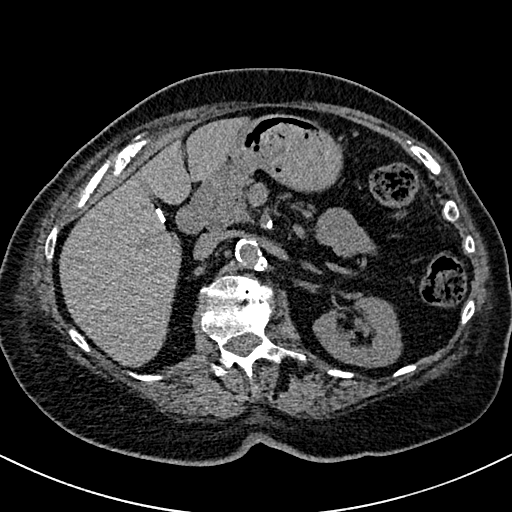
[im 12/154  lung]
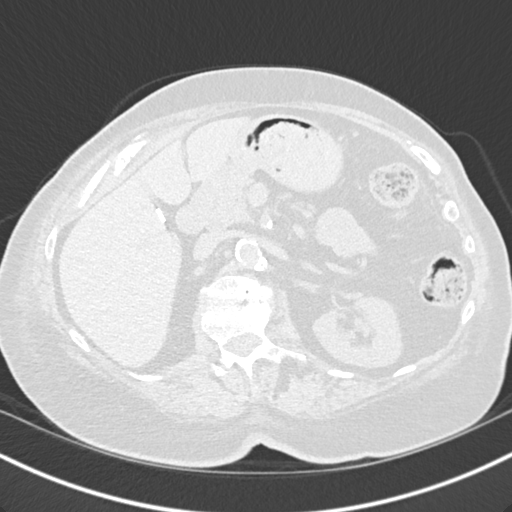
[im 23/154  lung]
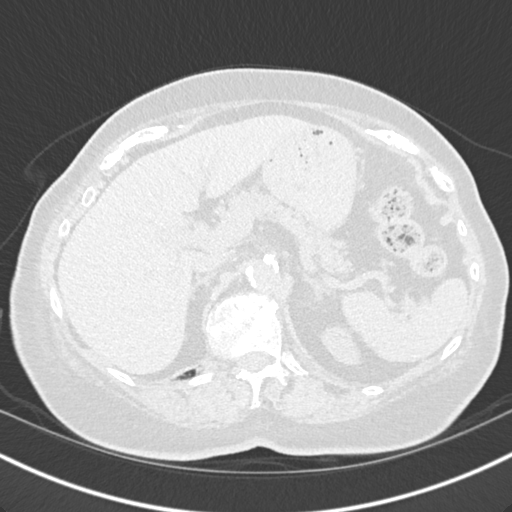
[im 35/154  lung]
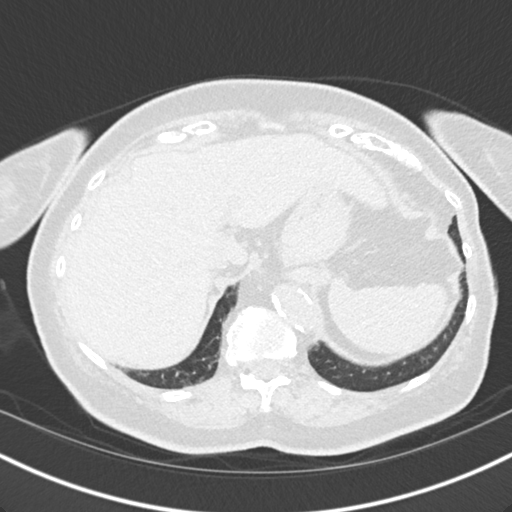
[im 46/154  lung]
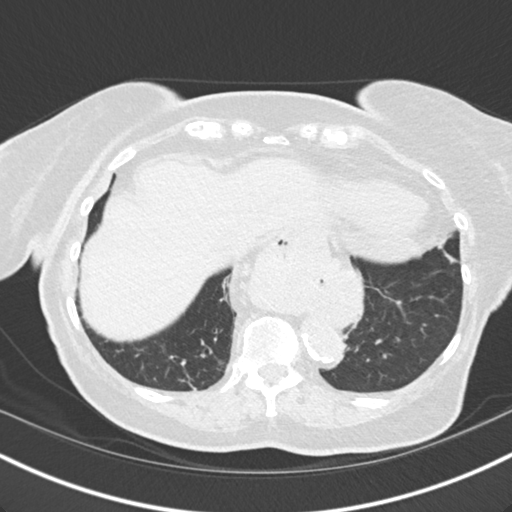
[im 57/154  mediastinal]
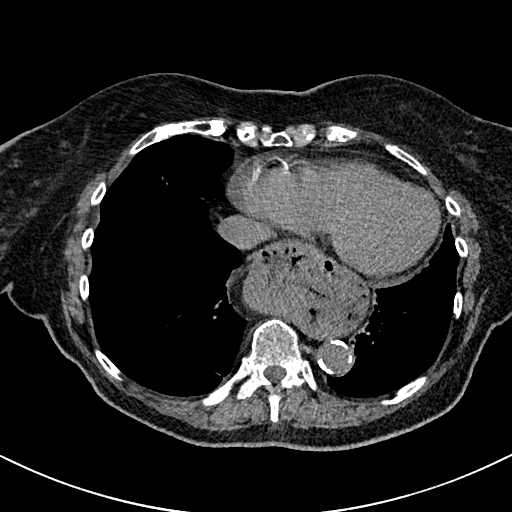
[im 57/154  lung]
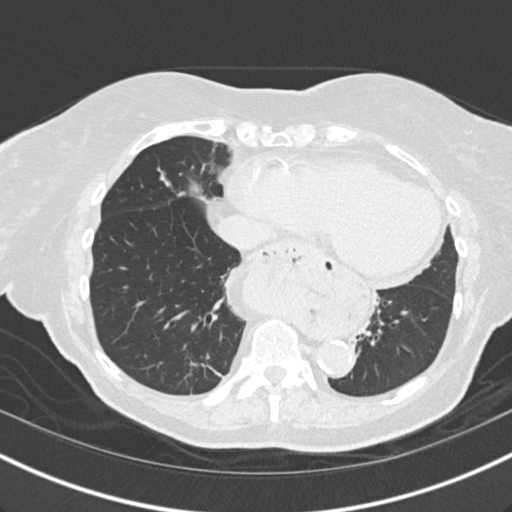
[im 69/154  lung]
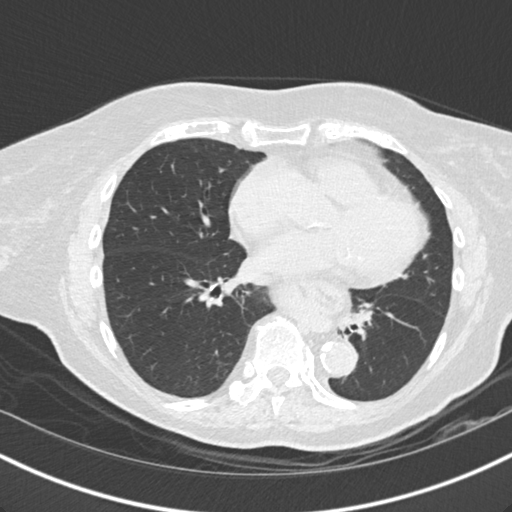
[im 86/154  lung]
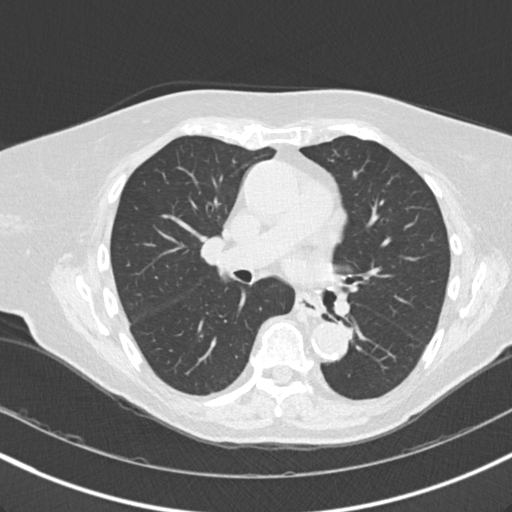
[im 97/154  lung]
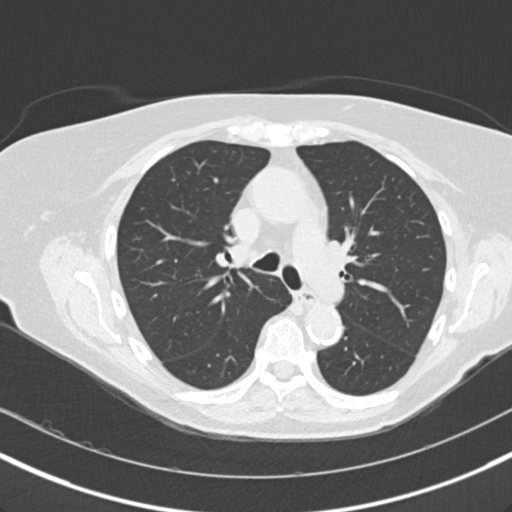
[im 108/154  mediastinal]
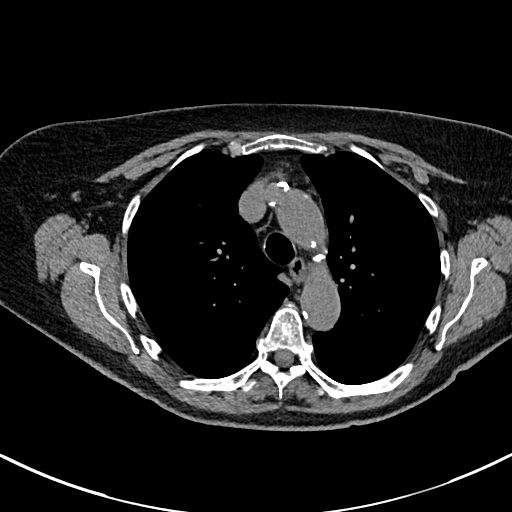
[im 108/154  lung]
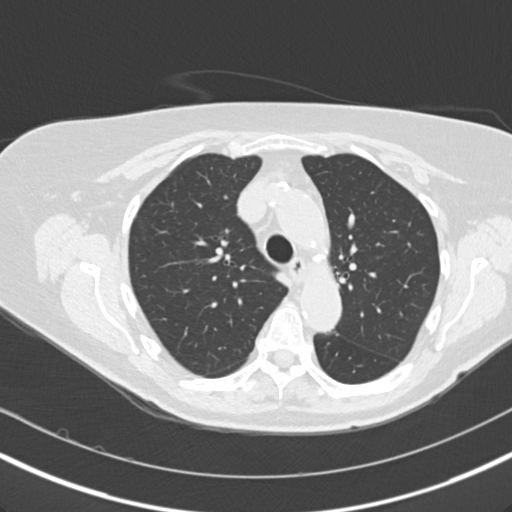
[im 120/154  lung]
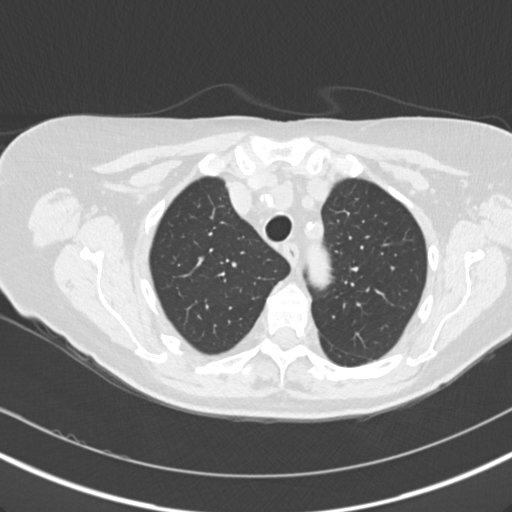
[im 131/154  lung]
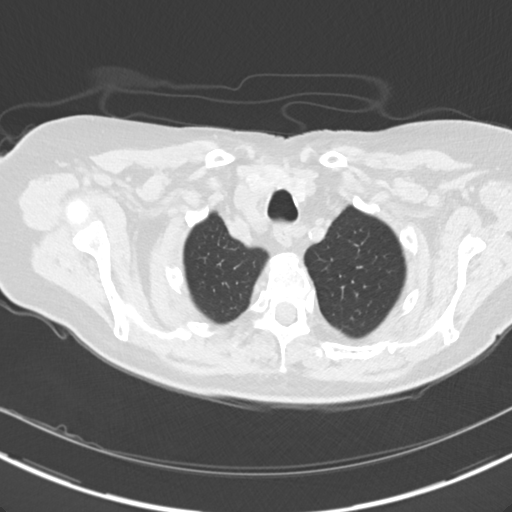
[im 142/154  lung]
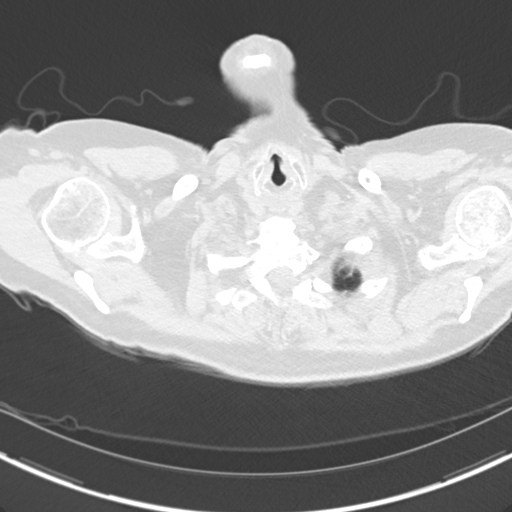

[Series 5: coronal · coronal · 0.60mm/px · 3 of 113 slices shown]
[im 23/113  lung]
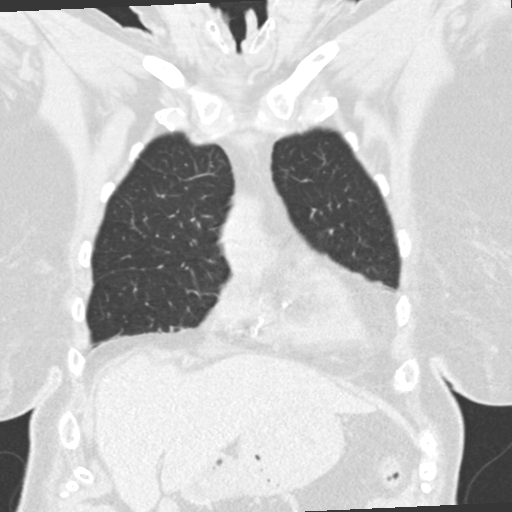
[im 45/113  lung]
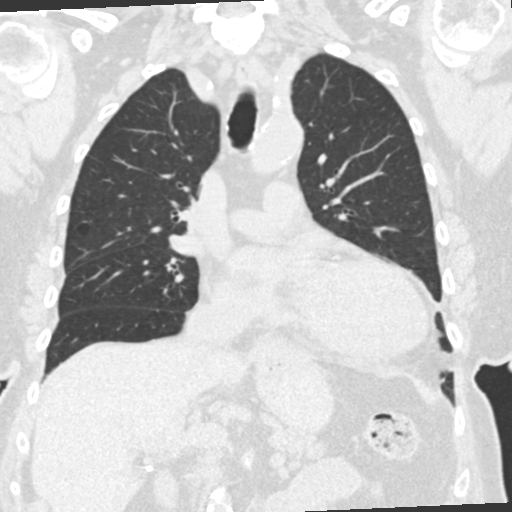
[im 68/113  lung]
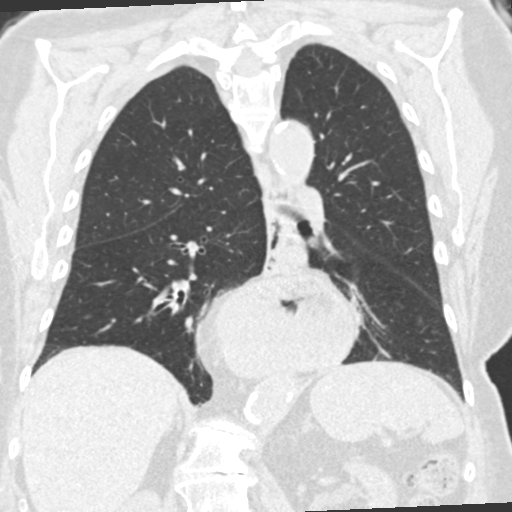

[15 of 36 positions shown; findings below may reference images not displayed]

FINDINGS: Cardiovascular: Moderate atherosclerosis of the aorta, great vessels
and coronary arteries. No acute vascular findings on noncontrast
imaging. The heart size is normal. There is a stable small
pericardial effusion versus pericardial thickening.

Mediastinum/Nodes: There are no enlarged mediastinal, hilar or
axillary lymph nodes. Stable mild thyroid nodularity and a moderate
size hiatal hernia.

Lungs/Pleura: There is no pleural effusion. Clustered nodularity in
the right lower lobe is similar to the prior examination (images
88-96 of series 3). The inferior extent of this has improved. There
is new clustered nodularity in the right middle lobe (images 92
through 98 of series 3). Previously described subpleural nodularity
in both lower lobes has improved. No suspicious nodules identified.

Upper abdomen: No suspicious findings in the visualized upper
abdomen. Stable small low-density lesion in the left hepatic lobe on
image 113/2. Aortic and branch vessel atherosclerosis and previous
cholecystectomy noted.

Musculoskeletal/Chest wall: There is no chest wall mass or
suspicious osseous finding. Thoracolumbar spondylosis and scoliosis
noted.
IMPRESSION: 1. Fluctuating tree-in-bud nodularity in the right lung, slightly
improved in the right lower lobe, but new in the right middle lobe.
This appearance is most consistent with a chronic inflammatory
process, likely mycobacterium avium intracellular infection.
Subpleural nodularity in both lower lobes has improved. No
suspicious nodules.
2. Coronary and Aortic Atherosclerosis ([id number]).
3. Moderate-size hiatal hernia.

## 2019-11-29 DIAGNOSIS — L299 Pruritus, unspecified: Secondary | ICD-10-CM | POA: Diagnosis not present

## 2019-11-29 DIAGNOSIS — B029 Zoster without complications: Secondary | ICD-10-CM | POA: Diagnosis not present

## 2019-12-13 DIAGNOSIS — B029 Zoster without complications: Secondary | ICD-10-CM | POA: Diagnosis not present

## 2020-01-25 DIAGNOSIS — E538 Deficiency of other specified B group vitamins: Secondary | ICD-10-CM | POA: Diagnosis not present

## 2020-01-25 DIAGNOSIS — I2510 Atherosclerotic heart disease of native coronary artery without angina pectoris: Secondary | ICD-10-CM | POA: Diagnosis not present

## 2020-01-25 DIAGNOSIS — E782 Mixed hyperlipidemia: Secondary | ICD-10-CM | POA: Diagnosis not present

## 2020-01-25 DIAGNOSIS — J479 Bronchiectasis, uncomplicated: Secondary | ICD-10-CM | POA: Diagnosis not present

## 2020-01-25 DIAGNOSIS — I10 Essential (primary) hypertension: Secondary | ICD-10-CM | POA: Diagnosis not present

## 2020-01-25 DIAGNOSIS — M5136 Other intervertebral disc degeneration, lumbar region: Secondary | ICD-10-CM | POA: Diagnosis not present

## 2020-01-25 DIAGNOSIS — R7303 Prediabetes: Secondary | ICD-10-CM | POA: Diagnosis not present

## 2020-01-25 DIAGNOSIS — I700 Atherosclerosis of aorta: Secondary | ICD-10-CM | POA: Diagnosis not present

## 2020-02-07 DIAGNOSIS — N952 Postmenopausal atrophic vaginitis: Secondary | ICD-10-CM | POA: Diagnosis not present

## 2020-02-07 DIAGNOSIS — R350 Frequency of micturition: Secondary | ICD-10-CM | POA: Diagnosis not present

## 2020-02-07 DIAGNOSIS — R351 Nocturia: Secondary | ICD-10-CM | POA: Diagnosis not present

## 2020-03-07 DIAGNOSIS — R350 Frequency of micturition: Secondary | ICD-10-CM | POA: Diagnosis not present

## 2020-03-07 DIAGNOSIS — N952 Postmenopausal atrophic vaginitis: Secondary | ICD-10-CM | POA: Diagnosis not present

## 2020-03-07 DIAGNOSIS — R351 Nocturia: Secondary | ICD-10-CM | POA: Diagnosis not present

## 2020-03-16 DIAGNOSIS — L03115 Cellulitis of right lower limb: Secondary | ICD-10-CM | POA: Diagnosis not present

## 2020-04-12 DIAGNOSIS — I10 Essential (primary) hypertension: Secondary | ICD-10-CM | POA: Diagnosis not present

## 2020-04-12 DIAGNOSIS — R600 Localized edema: Secondary | ICD-10-CM | POA: Diagnosis not present

## 2020-04-12 DIAGNOSIS — N393 Stress incontinence (female) (male): Secondary | ICD-10-CM | POA: Diagnosis not present

## 2020-04-12 DIAGNOSIS — M5136 Other intervertebral disc degeneration, lumbar region: Secondary | ICD-10-CM | POA: Diagnosis not present

## 2020-04-12 DIAGNOSIS — R5381 Other malaise: Secondary | ICD-10-CM | POA: Diagnosis not present

## 2020-04-12 DIAGNOSIS — R5383 Other fatigue: Secondary | ICD-10-CM | POA: Diagnosis not present

## 2020-04-19 DIAGNOSIS — M545 Low back pain: Secondary | ICD-10-CM | POA: Diagnosis not present

## 2020-04-19 DIAGNOSIS — M5136 Other intervertebral disc degeneration, lumbar region: Secondary | ICD-10-CM | POA: Diagnosis not present

## 2020-05-11 DIAGNOSIS — M545 Low back pain: Secondary | ICD-10-CM | POA: Diagnosis not present

## 2020-05-18 DIAGNOSIS — M545 Low back pain: Secondary | ICD-10-CM | POA: Diagnosis not present

## 2020-06-06 DIAGNOSIS — R351 Nocturia: Secondary | ICD-10-CM | POA: Diagnosis not present

## 2020-06-06 DIAGNOSIS — R350 Frequency of micturition: Secondary | ICD-10-CM | POA: Diagnosis not present

## 2020-06-06 DIAGNOSIS — N952 Postmenopausal atrophic vaginitis: Secondary | ICD-10-CM | POA: Diagnosis not present

## 2020-06-21 DIAGNOSIS — J479 Bronchiectasis, uncomplicated: Secondary | ICD-10-CM | POA: Diagnosis not present

## 2020-06-21 DIAGNOSIS — J984 Other disorders of lung: Secondary | ICD-10-CM | POA: Diagnosis not present

## 2020-06-23 DIAGNOSIS — J479 Bronchiectasis, uncomplicated: Secondary | ICD-10-CM | POA: Diagnosis not present

## 2020-06-23 DIAGNOSIS — J984 Other disorders of lung: Secondary | ICD-10-CM | POA: Diagnosis not present

## 2020-07-04 DIAGNOSIS — R351 Nocturia: Secondary | ICD-10-CM | POA: Diagnosis not present

## 2020-07-04 DIAGNOSIS — N952 Postmenopausal atrophic vaginitis: Secondary | ICD-10-CM | POA: Diagnosis not present

## 2020-07-04 DIAGNOSIS — R350 Frequency of micturition: Secondary | ICD-10-CM | POA: Diagnosis not present

## 2020-07-24 DIAGNOSIS — J479 Bronchiectasis, uncomplicated: Secondary | ICD-10-CM | POA: Diagnosis not present

## 2020-07-24 DIAGNOSIS — J984 Other disorders of lung: Secondary | ICD-10-CM | POA: Diagnosis not present

## 2020-08-01 DIAGNOSIS — R918 Other nonspecific abnormal finding of lung field: Secondary | ICD-10-CM | POA: Diagnosis not present

## 2020-08-01 DIAGNOSIS — R7303 Prediabetes: Secondary | ICD-10-CM | POA: Diagnosis not present

## 2020-08-01 DIAGNOSIS — Z0000 Encounter for general adult medical examination without abnormal findings: Secondary | ICD-10-CM | POA: Diagnosis not present

## 2020-08-01 DIAGNOSIS — J984 Other disorders of lung: Secondary | ICD-10-CM | POA: Diagnosis not present

## 2020-08-01 DIAGNOSIS — R600 Localized edema: Secondary | ICD-10-CM | POA: Diagnosis not present

## 2020-08-01 DIAGNOSIS — N393 Stress incontinence (female) (male): Secondary | ICD-10-CM | POA: Diagnosis not present

## 2020-08-01 DIAGNOSIS — I110 Hypertensive heart disease with heart failure: Secondary | ICD-10-CM | POA: Diagnosis not present

## 2020-08-01 DIAGNOSIS — I700 Atherosclerosis of aorta: Secondary | ICD-10-CM | POA: Diagnosis not present

## 2020-08-01 DIAGNOSIS — I10 Essential (primary) hypertension: Secondary | ICD-10-CM | POA: Diagnosis not present

## 2020-08-01 DIAGNOSIS — F419 Anxiety disorder, unspecified: Secondary | ICD-10-CM | POA: Diagnosis not present

## 2020-08-01 DIAGNOSIS — E782 Mixed hyperlipidemia: Secondary | ICD-10-CM | POA: Diagnosis not present

## 2020-08-01 DIAGNOSIS — J479 Bronchiectasis, uncomplicated: Secondary | ICD-10-CM | POA: Diagnosis not present

## 2020-08-01 DIAGNOSIS — Z23 Encounter for immunization: Secondary | ICD-10-CM | POA: Diagnosis not present

## 2020-08-01 DIAGNOSIS — E538 Deficiency of other specified B group vitamins: Secondary | ICD-10-CM | POA: Diagnosis not present

## 2020-08-01 DIAGNOSIS — R5381 Other malaise: Secondary | ICD-10-CM | POA: Diagnosis not present

## 2020-08-23 DIAGNOSIS — J479 Bronchiectasis, uncomplicated: Secondary | ICD-10-CM | POA: Diagnosis not present

## 2020-08-23 DIAGNOSIS — J984 Other disorders of lung: Secondary | ICD-10-CM | POA: Diagnosis not present

## 2020-09-23 DIAGNOSIS — J984 Other disorders of lung: Secondary | ICD-10-CM | POA: Diagnosis not present

## 2020-09-23 DIAGNOSIS — J479 Bronchiectasis, uncomplicated: Secondary | ICD-10-CM | POA: Diagnosis not present

## 2020-10-23 DIAGNOSIS — J984 Other disorders of lung: Secondary | ICD-10-CM | POA: Diagnosis not present

## 2020-10-23 DIAGNOSIS — J479 Bronchiectasis, uncomplicated: Secondary | ICD-10-CM | POA: Diagnosis not present

## 2021-03-15 ENCOUNTER — Other Ambulatory Visit: Payer: Self-pay

## 2021-03-15 ENCOUNTER — Encounter (HOSPITAL_COMMUNITY): Payer: Self-pay | Admitting: Orthopedic Surgery

## 2021-03-15 NOTE — Progress Notes (Signed)
COVID Vaccine Completed: Yes  Date COVID Vaccine completed: x2 Has received booster: Yes COVID vaccine manufacturer: Pfizer     Date of COVID positive in last 90 days: No  PCP - Gordan Payment, MD Cardiologist - N/A  Chest x-ray - greater than 1 year in epic EKG - greater than 1 year in epic Stress Test -  Greater 2 years ECHO -  N/A Cardiac Cath -  N/A Pacemaker/ICD device last checked: N/A  Sleep Study -  N/A CPAP -  N/A  Fasting Blood Sugar -  N/A Checks Blood Sugar __ N/A___ times a day  Blood Thinner Instructions: N/A Aspirin Instructions: N/A Last Dose: N/A  Activity level:  Can go up a flight of stairs and activities of daily living without stopping and without symptoms    Anesthesia review:  N/A  Patient denies shortness of breath, fever, cough and chest pain at PAT appointment   Patient verbalized understanding of instructions that were given to them at the PAT appointment. Patient was also instructed that they will need to review over the PAT instructions again at home before surgery.

## 2021-03-16 ENCOUNTER — Other Ambulatory Visit (HOSPITAL_COMMUNITY)
Admission: RE | Admit: 2021-03-16 | Discharge: 2021-03-16 | Disposition: A | Discharge status: Home / Self Care | Payer: Medicare Other | Source: Ambulatory Visit | Attending: Orthopedic Surgery | Admitting: Orthopedic Surgery

## 2021-03-16 DIAGNOSIS — Z01812 Encounter for preprocedural laboratory examination: Secondary | ICD-10-CM | POA: Insufficient documentation

## 2021-03-16 DIAGNOSIS — Z20822 Contact with and (suspected) exposure to covid-19: Secondary | ICD-10-CM | POA: Diagnosis not present

## 2021-03-16 LAB — SARS CORONAVIRUS 2 (TAT 6-24 HRS): SARS Coronavirus 2: NEGATIVE

## 2021-03-20 ENCOUNTER — Encounter (HOSPITAL_COMMUNITY)
Admission: RE | Disposition: A | Discharge status: Home / Self Care | Payer: Self-pay | Source: Home / Self Care | Attending: Orthopedic Surgery

## 2021-03-20 ENCOUNTER — Ambulatory Visit (HOSPITAL_COMMUNITY): Payer: Medicare Other | Admitting: Anesthesiology

## 2021-03-20 ENCOUNTER — Ambulatory Visit (HOSPITAL_COMMUNITY): Payer: Medicare Other

## 2021-03-20 ENCOUNTER — Observation Stay (HOSPITAL_COMMUNITY)
Admission: RE | Admit: 2021-03-20 | Discharge: 2021-03-22 | Disposition: A | Discharge status: Home / Self Care | Payer: Medicare Other | Attending: Orthopedic Surgery | Admitting: Orthopedic Surgery

## 2021-03-20 DIAGNOSIS — Z79899 Other long term (current) drug therapy: Secondary | ICD-10-CM | POA: Diagnosis not present

## 2021-03-20 DIAGNOSIS — S42221A 2-part displaced fracture of surgical neck of right humerus, initial encounter for closed fracture: Principal | ICD-10-CM | POA: Insufficient documentation

## 2021-03-20 DIAGNOSIS — E039 Hypothyroidism, unspecified: Secondary | ICD-10-CM | POA: Insufficient documentation

## 2021-03-20 DIAGNOSIS — W1839XA Other fall on same level, initial encounter: Secondary | ICD-10-CM | POA: Insufficient documentation

## 2021-03-20 DIAGNOSIS — I10 Essential (primary) hypertension: Secondary | ICD-10-CM | POA: Diagnosis not present

## 2021-03-20 DIAGNOSIS — Z20822 Contact with and (suspected) exposure to covid-19: Secondary | ICD-10-CM | POA: Diagnosis not present

## 2021-03-20 DIAGNOSIS — J449 Chronic obstructive pulmonary disease, unspecified: Secondary | ICD-10-CM | POA: Insufficient documentation

## 2021-03-20 DIAGNOSIS — S42209A Unspecified fracture of upper end of unspecified humerus, initial encounter for closed fracture: Secondary | ICD-10-CM | POA: Diagnosis present

## 2021-03-20 HISTORY — DX: Personal history of other diseases of the digestive system: Z87.19

## 2021-03-20 HISTORY — DX: Cerebral infarction, unspecified: I63.9

## 2021-03-20 HISTORY — PX: ORIF HUMERUS FRACTURE: SHX2126

## 2021-03-20 HISTORY — DX: Transient cerebral ischemic attack, unspecified: G45.9

## 2021-03-20 HISTORY — DX: Personal history of other endocrine, nutritional and metabolic disease: Z86.39

## 2021-03-20 HISTORY — DX: Chronic obstructive pulmonary disease, unspecified: J44.9

## 2021-03-20 HISTORY — DX: Pneumonia, unspecified organism: J18.9

## 2021-03-20 HISTORY — DX: Personal history of urinary calculi: Z87.442

## 2021-03-20 HISTORY — DX: Essential (primary) hypertension: I10

## 2021-03-20 LAB — CBC
HCT: 37.5 % (ref 36.0–46.0)
Hemoglobin: 11.6 g/dL — ABNORMAL LOW (ref 12.0–15.0)
MCH: 28.9 pg (ref 26.0–34.0)
MCHC: 30.9 g/dL (ref 30.0–36.0)
MCV: 93.3 fL (ref 80.0–100.0)
Platelets: 378 10*3/uL (ref 150–400)
RBC: 4.02 MIL/uL (ref 3.87–5.11)
RDW: 15.5 % (ref 11.5–15.5)
WBC: 7.9 10*3/uL (ref 4.0–10.5)
nRBC: 0 % (ref 0.0–0.2)

## 2021-03-20 LAB — BASIC METABOLIC PANEL
Anion gap: 8 (ref 5–15)
BUN: 9 mg/dL (ref 8–23)
CO2: 22 mmol/L (ref 22–32)
Calcium: 9.9 mg/dL (ref 8.9–10.3)
Chloride: 109 mmol/L (ref 98–111)
Creatinine, Ser: 0.56 mg/dL (ref 0.44–1.00)
GFR, Estimated: >60 mL/min (ref >60)
Glucose, Bld: 97 mg/dL (ref 70–99)
Potassium: 4.7 mmol/L (ref 3.5–5.1)
Sodium: 139 mmol/L (ref 135–145)

## 2021-03-20 SURGERY — OPEN REDUCTION INTERNAL FIXATION (ORIF) PROXIMAL HUMERUS FRACTURE
Anesthesia: Regional | Laterality: Right

## 2021-03-20 MED ORDER — DEXMEDETOMIDINE (PRECEDEX) IN NS 20 MCG/5ML (4 MCG/ML) IV SYRINGE
PREFILLED_SYRINGE | INTRAVENOUS | Status: AC
  Filled 2021-03-20: qty 10

## 2021-03-20 MED ORDER — DOCUSATE SODIUM 100 MG PO CAPS
100.0000 mg | ORAL_CAPSULE | Freq: Two times a day (BID) | ORAL | Status: DC
  Administered 2021-03-21 – 2021-03-22 (×2): 100 mg via ORAL
  Filled 2021-03-20 (×3): qty 1

## 2021-03-20 MED ORDER — ROCURONIUM BROMIDE 10 MG/ML (PF) SYRINGE
PREFILLED_SYRINGE | INTRAVENOUS | Status: DC | PRN
  Administered 2021-03-20: 60 mg via INTRAVENOUS

## 2021-03-20 MED ORDER — CHLORHEXIDINE GLUCONATE 0.12 % MT SOLN: 15.0000 mL | Freq: Once | OROMUCOSAL | Status: AC

## 2021-03-20 MED ORDER — SODIUM CHLORIDE 0.9 % IR SOLN
Status: DC | PRN
  Administered 2021-03-20: 1000 mL

## 2021-03-20 MED ORDER — OXYCODONE HCL 5 MG PO TABS
10.0000 mg | ORAL_TABLET | ORAL | Status: DC | PRN
  Administered 2021-03-21 – 2021-03-22 (×3): 10 mg via ORAL
  Filled 2021-03-20 (×3): qty 2

## 2021-03-20 MED ORDER — BISACODYL 5 MG PO TBEC: 5.0000 mg | DELAYED_RELEASE_TABLET | Freq: Every day | ORAL | Status: DC | PRN

## 2021-03-20 MED ORDER — MAGNESIUM CITRATE PO SOLN: 1.0000 | Freq: Once | ORAL | Status: DC | PRN

## 2021-03-20 MED ORDER — PROPOFOL 10 MG/ML IV BOLUS
INTRAVENOUS | Status: DC | PRN
  Administered 2021-03-20: 70 mg via INTRAVENOUS

## 2021-03-20 MED ORDER — PHENYLEPHRINE HCL-NACL 10-0.9 MG/250ML-% IV SOLN
INTRAVENOUS | Status: DC | PRN
  Administered 2021-03-20: 30 ug/min via INTRAVENOUS

## 2021-03-20 MED ORDER — DIPHENHYDRAMINE HCL 12.5 MG/5ML PO ELIX: 12.5000 mg | ORAL_SOLUTION | ORAL | Status: DC | PRN

## 2021-03-20 MED ORDER — ESMOLOL HCL 100 MG/10ML IV SOLN
INTRAVENOUS | Status: DC | PRN
  Administered 2021-03-20: 30 mg via INTRAVENOUS

## 2021-03-20 MED ORDER — PHENYLEPHRINE 40 MCG/ML (10ML) SYRINGE FOR IV PUSH (FOR BLOOD PRESSURE SUPPORT)
PREFILLED_SYRINGE | INTRAVENOUS | Status: DC | PRN
  Administered 2021-03-20: 200 ug via INTRAVENOUS

## 2021-03-20 MED ORDER — FENTANYL CITRATE (PF) 100 MCG/2ML IJ SOLN
50.0000 ug | INTRAMUSCULAR | Status: DC
  Administered 2021-03-20: 50 ug via INTRAVENOUS
  Filled 2021-03-20: qty 2

## 2021-03-20 MED ORDER — METOCLOPRAMIDE HCL 5 MG/ML IJ SOLN: 5.0000 mg | Freq: Three times a day (TID) | INTRAMUSCULAR | Status: DC | PRN

## 2021-03-20 MED ORDER — ALBUTEROL SULFATE HFA 108 (90 BASE) MCG/ACT IN AERS: 2.0000 | INHALATION_SPRAY | Freq: Four times a day (QID) | RESPIRATORY_TRACT | Status: DC | PRN

## 2021-03-20 MED ORDER — LIDOCAINE 2% (20 MG/ML) 5 ML SYRINGE
INTRAMUSCULAR | Status: DC | PRN
  Administered 2021-03-20: 20 mg via INTRAVENOUS

## 2021-03-20 MED ORDER — TRANEXAMIC ACID-NACL 1000-0.7 MG/100ML-% IV SOLN
1000.0000 mg | INTRAVENOUS | Status: AC
  Administered 2021-03-20: 1000 mg via INTRAVENOUS
  Filled 2021-03-20: qty 100

## 2021-03-20 MED ORDER — CLONIDINE HCL 0.1 MG/24HR TD PTWK
0.1000 mg | MEDICATED_PATCH | TRANSDERMAL | Status: DC
  Administered 2021-03-20: 0.1 mg via TRANSDERMAL
  Filled 2021-03-20: qty 1

## 2021-03-20 MED ORDER — ONDANSETRON HCL 4 MG PO TABS: 4.0000 mg | ORAL_TABLET | Freq: Four times a day (QID) | ORAL | Status: DC | PRN

## 2021-03-20 MED ORDER — FENTANYL CITRATE (PF) 100 MCG/2ML IJ SOLN
INTRAMUSCULAR | Status: AC
  Filled 2021-03-20: qty 2

## 2021-03-20 MED ORDER — LOSARTAN POTASSIUM 50 MG PO TABS
50.0000 mg | ORAL_TABLET | Freq: Every day | ORAL | Status: DC
  Administered 2021-03-21 – 2021-03-22 (×2): 50 mg via ORAL
  Filled 2021-03-20 (×2): qty 1

## 2021-03-20 MED ORDER — OXYCODONE HCL 5 MG PO TABS
5.0000 mg | ORAL_TABLET | ORAL | Status: DC | PRN
  Administered 2021-03-21: 5 mg via ORAL
  Filled 2021-03-20: qty 1

## 2021-03-20 MED ORDER — DEXAMETHASONE SODIUM PHOSPHATE 10 MG/ML IJ SOLN
INTRAMUSCULAR | Status: DC | PRN
  Administered 2021-03-20: 5 mg via INTRAVENOUS

## 2021-03-20 MED ORDER — LACTATED RINGERS IV SOLN: INTRAVENOUS | Status: DC

## 2021-03-20 MED ORDER — PHENOL 1.4 % MT LIQD: 1.0000 | OROMUCOSAL | Status: DC | PRN

## 2021-03-20 MED ORDER — METHOCARBAMOL 500 MG PO TABS: 500.0000 mg | ORAL_TABLET | Freq: Four times a day (QID) | ORAL | Status: DC | PRN

## 2021-03-20 MED ORDER — ROCURONIUM BROMIDE 10 MG/ML (PF) SYRINGE
PREFILLED_SYRINGE | INTRAVENOUS | Status: AC
  Filled 2021-03-20: qty 10

## 2021-03-20 MED ORDER — MENTHOL 3 MG MT LOZG: 1.0000 | LOZENGE | OROMUCOSAL | Status: DC | PRN

## 2021-03-20 MED ORDER — MIDAZOLAM HCL 2 MG/2ML IJ SOLN: 1.0000 mg | INTRAMUSCULAR | Status: DC

## 2021-03-20 MED ORDER — SUGAMMADEX SODIUM 200 MG/2ML IV SOLN
INTRAVENOUS | Status: DC | PRN
  Administered 2021-03-20: 200 mg via INTRAVENOUS

## 2021-03-20 MED ORDER — POLYETHYLENE GLYCOL 3350 17 G PO PACK: 17.0000 g | PACK | Freq: Every day | ORAL | Status: DC | PRN

## 2021-03-20 MED ORDER — BUPIVACAINE HCL (PF) 0.5 % IJ SOLN
INTRAMUSCULAR | Status: DC | PRN
  Administered 2021-03-20: 15 mL via PERINEURAL

## 2021-03-20 MED ORDER — FENTANYL CITRATE (PF) 100 MCG/2ML IJ SOLN
25.0000 ug | INTRAMUSCULAR | Status: DC | PRN
  Administered 2021-03-20: 25 ug via INTRAVENOUS

## 2021-03-20 MED ORDER — FENTANYL CITRATE (PF) 100 MCG/2ML IJ SOLN
INTRAMUSCULAR | Status: DC | PRN
  Administered 2021-03-20: 50 ug via INTRAVENOUS

## 2021-03-20 MED ORDER — PANTOPRAZOLE SODIUM 40 MG PO TBEC
40.0000 mg | DELAYED_RELEASE_TABLET | Freq: Every day | ORAL | Status: DC
  Administered 2021-03-21 – 2021-03-22 (×2): 40 mg via ORAL
  Filled 2021-03-20 (×2): qty 1

## 2021-03-20 MED ORDER — CEFAZOLIN SODIUM-DEXTROSE 2-4 GM/100ML-% IV SOLN
2.0000 g | INTRAVENOUS | Status: AC
  Administered 2021-03-20: 2 g via INTRAVENOUS
  Filled 2021-03-20: qty 100

## 2021-03-20 MED ORDER — ACETAMINOPHEN 325 MG PO TABS
325.0000 mg | ORAL_TABLET | Freq: Four times a day (QID) | ORAL | Status: DC | PRN
  Administered 2021-03-21 – 2021-03-22 (×3): 650 mg via ORAL
  Filled 2021-03-20 (×3): qty 2

## 2021-03-20 MED ORDER — ORAL CARE MOUTH RINSE
15.0000 mL | Freq: Once | OROMUCOSAL | Status: AC
  Administered 2021-03-20: 15 mL via OROMUCOSAL

## 2021-03-20 MED ORDER — ACETAMINOPHEN 500 MG PO TABS
1000.0000 mg | ORAL_TABLET | Freq: Once | ORAL | Status: AC
  Administered 2021-03-20: 1000 mg via ORAL
  Filled 2021-03-20: qty 2

## 2021-03-20 MED ORDER — ONDANSETRON HCL 4 MG/2ML IJ SOLN: 4.0000 mg | Freq: Four times a day (QID) | INTRAMUSCULAR | Status: DC | PRN

## 2021-03-20 MED ORDER — HYDROCHLOROTHIAZIDE 25 MG PO TABS
25.0000 mg | ORAL_TABLET | Freq: Every day | ORAL | Status: DC
  Administered 2021-03-21 – 2021-03-22 (×2): 25 mg via ORAL
  Filled 2021-03-20 (×2): qty 1

## 2021-03-20 MED ORDER — METOCLOPRAMIDE HCL 5 MG PO TABS: 5.0000 mg | ORAL_TABLET | Freq: Three times a day (TID) | ORAL | Status: DC | PRN

## 2021-03-20 MED ORDER — HYDROMORPHONE HCL 1 MG/ML IJ SOLN: 0.5000 mg | INTRAMUSCULAR | Status: DC | PRN

## 2021-03-20 MED ORDER — GLYCOPYRROLATE PF 0.2 MG/ML IJ SOSY
PREFILLED_SYRINGE | INTRAMUSCULAR | Status: AC
  Filled 2021-03-20: qty 1

## 2021-03-20 MED ORDER — METHOCARBAMOL 500 MG IVPB - SIMPLE MED
500.0000 mg | Freq: Four times a day (QID) | INTRAVENOUS | Status: DC | PRN
  Administered 2021-03-20: 500 mg via INTRAVENOUS
  Filled 2021-03-20: qty 50

## 2021-03-20 MED ORDER — ONDANSETRON HCL 4 MG/2ML IJ SOLN
INTRAMUSCULAR | Status: DC | PRN
  Administered 2021-03-20: 4 mg via INTRAVENOUS

## 2021-03-20 MED ORDER — BUPIVACAINE LIPOSOME 1.3 % IJ SUSP
INTRAMUSCULAR | Status: DC | PRN
  Administered 2021-03-20: 10 mL via PERINEURAL

## 2021-03-20 MED ORDER — METHOCARBAMOL 500 MG IVPB - SIMPLE MED
INTRAVENOUS | Status: AC
  Filled 2021-03-20: qty 50

## 2021-03-20 MED ORDER — PHENYLEPHRINE 40 MCG/ML (10ML) SYRINGE FOR IV PUSH (FOR BLOOD PRESSURE SUPPORT)
PREFILLED_SYRINGE | INTRAVENOUS | Status: AC
  Filled 2021-03-20: qty 10

## 2021-03-20 MED ORDER — GABAPENTIN 300 MG PO CAPS: 300.0000 mg | ORAL_CAPSULE | Freq: Three times a day (TID) | ORAL | Status: DC | PRN

## 2021-03-20 SURGICAL SUPPLY — 69 items
3.5MM LOCKING SCREW 28MM (Orthopedic Implant) ×1 IMPLANT
4.0 LOCKING SCREW 28MM (Orthopedic Implant) ×1 IMPLANT
4.0 LOCKING SCREW 32MM (Orthopedic Implant) ×1 IMPLANT
4.0 LOCKING SCREW 38MM (Orthopedic Implant) ×1 IMPLANT
4.0 LOCKING SCREW 46MM (Orthopedic Implant) ×2 IMPLANT
ADH SKN CLS APL DERMABOND .7 (GAUZE/BANDAGES/DRESSINGS) ×1
AID PSTN UNV HD RSTRNT DISP (MISCELLANEOUS) ×1
BAG SPEC THK2 15X12 ZIP CLS (MISCELLANEOUS) ×1
BAG ZIPLOCK 12X15 (MISCELLANEOUS) ×2 IMPLANT
BIT DRILL CALIBRTD LNG 2.5X130 (DRILL) IMPLANT
COOLER ICEMAN CLASSIC (MISCELLANEOUS) ×1 IMPLANT
COVER SURGICAL LIGHT HANDLE (MISCELLANEOUS) ×2 IMPLANT
COVER WAND RF STERILE (DRAPES) IMPLANT
DERMABOND ADVANCED (GAUZE/BANDAGES/DRESSINGS) ×1
DERMABOND ADVANCED .7 DNX12 (GAUZE/BANDAGES/DRESSINGS) ×1 IMPLANT
DRAPE C-ARM 42X120 X-RAY (DRAPES) ×2 IMPLANT
DRAPE C-ARMOR (DRAPES) IMPLANT
DRAPE ORTHO SPLIT 77X108 STRL (DRAPES) ×4
DRAPE SURG 17X11 SM STRL (DRAPES) ×2 IMPLANT
DRAPE SURG ORHT 6 SPLT 77X108 (DRAPES) ×2 IMPLANT
DRAPE U-SHAPE 47X51 STRL (DRAPES) ×2 IMPLANT
DRILL CALIBRATED LONG 2.5X130 (DRILL) ×2
DRSG AQUACEL AG ADV 3.5X 6 (GAUZE/BANDAGES/DRESSINGS) ×1 IMPLANT
DRSG AQUACEL AG ADV 3.5X10 (GAUZE/BANDAGES/DRESSINGS) IMPLANT
DURAPREP 26ML APPLICATOR (WOUND CARE) ×2 IMPLANT
ELECT BLADE TIP CTD 4 INCH (ELECTRODE) ×3 IMPLANT
ELECT REM PT RETURN 15FT ADLT (MISCELLANEOUS) ×2 IMPLANT
GLOVE SURG ENC MOIS LTX SZ7.5 (GLOVE) ×2 IMPLANT
GLOVE SURG ENC MOIS LTX SZ8 (GLOVE) ×2 IMPLANT
GLOVE SURG MICRO LTX SZ7 (GLOVE) ×2 IMPLANT
GLOVE SURG MICRO LTX SZ7.5 (GLOVE) ×2 IMPLANT
GOWN STRL REUS W/TWL LRG LVL3 (GOWN DISPOSABLE) ×4 IMPLANT
GUIDEWIRE TROC TIP 2X150 (WIRE) ×1 IMPLANT
KIT BASIN OR (CUSTOM PROCEDURE TRAY) ×2 IMPLANT
KIT TURNOVER KIT A (KITS) ×2 IMPLANT
MANIFOLD NEPTUNE II (INSTRUMENTS) ×2 IMPLANT
NDL TAPERED W/ NITINOL LOOP (MISCELLANEOUS) IMPLANT
NEEDLE TAPERED W/ NITINOL LOOP (MISCELLANEOUS) ×2 IMPLANT
NS IRRIG 1000ML POUR BTL (IV SOLUTION) ×2 IMPLANT
PACK SHOULDER (CUSTOM PROCEDURE TRAY) ×2 IMPLANT
PAD ARMBOARD 7.5X6 YLW CONV (MISCELLANEOUS) ×4 IMPLANT
PAD COLD SHLDR UNI WRAP-ON (PAD) ×2
PAD COLD UNI WRAP-ON (PAD) IMPLANT
PLATE 3H PROX HUMERAL (Plate) ×1 IMPLANT
PROTECTOR NERVE ULNAR (MISCELLANEOUS) ×2 IMPLANT
PUTTY DBM STAGRAFT PLUS 10CC (Putty) ×1 IMPLANT
RESTRAINT HEAD UNIVERSAL NS (MISCELLANEOUS) ×2 IMPLANT
SCREW 26 LOCKING CANCELLOUS (Screw) ×1 IMPLANT
SCREW CORT 3.5X28 (Screw) ×1 IMPLANT
SCREW LOCK DL 3.5X28 (Screw) ×2 IMPLANT
SCREW LOCK FTHD 4X28 (Screw) ×1 IMPLANT
SCREW LOCK FTHD 4X32 (Screw) ×1 IMPLANT
SCREW LOCK FTHD 4X38 (Screw) ×1 IMPLANT
SCREW LOCK FTHD 4X46 (Screw) ×2 IMPLANT
SCREW LOCKING CN FT 4X36 (Screw) ×2 IMPLANT
SCREW LOCKING CN FT 4X40 (Screw) ×1 IMPLANT
SCREW LOCKING CN FT 4X42 (Screw) ×1 IMPLANT
SLING ARM FOAM STRAP LRG (SOFTGOODS) IMPLANT
SLING ARM FOAM STRAP MED (SOFTGOODS) ×1 IMPLANT
SLING ARM FOAM STRAP SML (SOFTGOODS) IMPLANT
SUCTION FRAZIER HANDLE 12FR (TUBING) ×2
SUCTION TUBE FRAZIER 12FR DISP (TUBING) ×1 IMPLANT
SUT FIBERWIRE #2 38 T-5 BLUE (SUTURE) ×4
SUT MNCRL AB 3-0 PS2 18 (SUTURE) ×2 IMPLANT
SUT MON AB 2-0 CT1 36 (SUTURE) ×2 IMPLANT
SUT VIC AB 1 CT1 36 (SUTURE) ×2 IMPLANT
SUTURE FIBERWR #2 38 T-5 BLUE (SUTURE) IMPLANT
TOWEL OR 17X26 10 PK STRL BLUE (TOWEL DISPOSABLE) ×2 IMPLANT
TOWEL OR NON WOVEN STRL DISP B (DISPOSABLE) ×2 IMPLANT

## 2021-03-20 NOTE — Transfer of Care (Signed)
Immediate Anesthesia Transfer of Care Note  Patient: Melissa Herring  Procedure(s) Performed: OPEN REDUCTION INTERNAL FIXATION (ORIF) PROXIMAL HUMERUS FRACTURE (Right )  Patient Location: PACU  Anesthesia Type:General  Level of Consciousness: sedated, patient cooperative and responds to stimulation  Airway & Oxygen Therapy: Patient Spontanous Breathing and Patient connected to face mask oxygen  Post-op Assessment: Report given to RN and Post -op Vital signs reviewed and stable  Post vital signs: Reviewed and stable  Last Vitals:  Vitals Value Taken Time  BP 174/95 03/20/21 1845  Temp 36.5 C 03/20/21 1844  Pulse 88 03/20/21 1847  Resp 19 03/20/21 1847  SpO2 100 % 03/20/21 1847  Vitals shown include unvalidated device data.  Last Pain:  Vitals:   03/20/21 1547  TempSrc:   PainSc: 0-No pain         Complications: No complications documented.

## 2021-03-20 NOTE — H&P (Signed)
[name]    Chief Complaint: Displaced right 2 part proximal humerus fracture HPI: The patient is a 87 y.o. female status post ground level mechanical fall sustaining a severely displaced 2 part proximal humerus fracture.  Due to the degree of displacement she is brought to the operating room at this time for planned open reduction and internal fixation  Past Medical History:  Diagnosis Date  . COPD (chronic obstructive pulmonary disease) (HCC)   . Diverticulitis   . GERD (gastroesophageal reflux disease)   . History of hiatal hernia   . History of hypothyroidism   . History of kidney stones   . Hypertension   . Mini stroke [facility])    about 10 years ago per patient [facility]  . Pneumonia     Past Surgical History:  Procedure Laterality Date  . ABDOMINAL HYSTERECTOMY    . CHOLECYSTECTOMY    . COLONOSCOPY    . LUMBAR LAMINECTOMY/DECOMPRESSION MICRODISCECTOMY N/A 06/12/2018   Procedure: Central decompression lumbar laminectomy L4-5;  Surgeon: Ranee Gosselin, MD;  Location: [facility] ORS;  Service: Orthopedics;  Laterality: N/A;  . TONSILLECTOMY    . UPPER GI ENDOSCOPY    . WISDOM TOOTH EXTRACTION      Family History  Problem Relation Age of Onset  . Colon cancer Daughter   . Breast cancer Mother   . Diverticulosis Father     Social History:  reports that she has never smoked. She has never used smokeless tobacco. She reports that she does not drink alcohol and does not use drugs.   Medications Prior to Admission  Medication Sig Dispense Refill  . acetaminophen (TYLENOL) 500 MG tablet Take 1,000 mg by mouth every 6 (six) hours as needed for moderate pain or mild pain.    . cholecalciferol (VITAMIN D3) 25 MCG (1000 UNIT) tablet Take 1,000 Units by mouth daily.    . cloNIDine (CATAPRES - DOSED IN MG/24 HR) 0.1 mg/24hr patch Place 0.1 mg onto the skin once a week.    . Coenzyme Q10 (COQ-10) 400 MG CAPS Take 200 mg by mouth daily.    Marland Kitchen esomeprazole (NEXIUM) 40 MG capsule  Take 30- 60 min before your first and last meals of the day (Patient taking differently: Take 20 mg by mouth daily as needed (Heartburn). Take 30- 60 min before your first and last meals of the day) 60 capsule 2  . gabapentin (NEURONTIN) 300 MG capsule Take 300 mg by mouth 3 (three) times daily as needed (Nerve pain).    . hydrochlorothiazide (HYDRODIURIL) 25 MG tablet Take 25 mg by mouth daily.    Marland Kitchen losartan (COZAAR) 50 MG tablet Take 50 mg by mouth daily.    . Menthol-Zinc Oxide (CALMOSEPTINE) 0.44-20.6 % OINT Apply 1 application topically daily as needed (Sore Spots).    . multivitamin-lutein (OCUVITE-LUTEIN) CAPS capsule Take 1 capsule by mouth daily.    Marland Kitchen oxyCODONE (OXY IR/ROXICODONE) 5 MG immediate release tablet Take 5-10 mg by mouth every 4 (four) hours as needed for pain.    . Tetrahydrozoline HCl (VISINE OP) Place 1 drop into both eyes daily at 12 noon.    . Vitamin A 2400 MCG (8000 UT) CAPS Take 8,000 Units by mouth daily.    . vitamin B-12 (CYANOCOBALAMIN) 500 MCG tablet Take 500 mcg by mouth daily.    . vitamin C (ASCORBIC ACID) 500 MG tablet Take 500 mg by mouth daily.    . vitamin E 400 UNIT capsule Take 400 Units by mouth  daily.    . albuterol (VENTOLIN HFA) 108 (90 Base) MCG/ACT inhaler Inhale 2 puffs into the lungs every 6 (six) hours as needed for wheezing or shortness of breath.       Physical Exam: Patient is an elderly, well-developed female in obvious extreme discomfort secondary to right shoulder and arm pain.  On inspection she has severe diffuse swelling of the entire right upper extremity with broad areas of evolving ecchymosis.  Compartments are all soft.  She has diffuse tenderness with palpation about the shoulder and upper arm.  She is grossly neurovascular intact distally with the EPL, APB, and the first dorsal interossei all 4 out of 5 in strength with some moderate decreased secondary to pain.  Radiographs  Plain films of the right shoulder demonstrate a severely  displaced 2 part proximal humerus fracture.  The humeral shaft is 300% medially displaced in relation to the humeral head.  Vitals  Temp:  [98.3 F (36.8 C)] 98.3 F (36.8 C) (05/03 1322) Pulse Rate:  [89] 89 (05/03 1322) Resp:  [16] 16 (05/03 1322) BP: (150)/(68) 150/68 (05/03 1322) SpO2:  [98 %] 98 % (05/03 1322)  Assessment/Plan  Impression: Displaced right 2 part proximal humerus fracture  Plan of Action: Procedure(s): OPEN REDUCTION INTERNAL FIXATION (ORIF) PROXIMAL HUMERUS FRACTURE  Militza Devery M Zebastian Carico 03/20/2021, 3:14 PM Contact # [phone]

## 2021-03-20 NOTE — Anesthesia Procedure Notes (Signed)
Procedure Name: Intubation Performed by: Victorio Creeden, CRNA Pre-anesthesia Checklist: Patient identified, Emergency Drugs available, Suction available and Patient being monitored Patient Re-evaluated:Patient Re-evaluated prior to induction Oxygen Delivery Method: Circle system utilized Preoxygenation: Pre-oxygenation with 100% oxygen Induction Type: IV induction Ventilation: Mask ventilation without difficulty Laryngoscope Size: Mac and 3 Grade View: Grade I Tube type: Oral Tube size: 7.0 mm Number of attempts: 1 Airway Equipment and Method: Stylet and Oral airway Placement Confirmation: ETT inserted through vocal cords under direct vision,  positive ETCO2 and breath sounds checked- equal and bilateral Secured at: 21 cm Tube secured with: Tape Dental Injury: Teeth and Oropharynx as per pre-operative assessment        

## 2021-03-20 NOTE — Anesthesia Preprocedure Evaluation (Addendum)
Anesthesia Evaluation  Patient identified by MRN, date of birth, ID band Patient awake    Reviewed: Allergy & Precautions, NPO status , Patient's Chart, lab work & pertinent test results  History of Anesthesia Complications (+) MALIGNANT HYPERTHERMIA  Airway Mallampati: II  TM Distance: >3 FB Neck ROM: Full    Dental no notable dental hx. (+) Teeth Intact, Dental Advisory Given   Pulmonary shortness of breath, COPD,  COPD inhaler,    Pulmonary exam normal breath sounds clear to auscultation       Cardiovascular hypertension, Pt. on medications Normal cardiovascular exam Rhythm:Regular Rate:Normal     Neuro/Psych PSYCHIATRIC DISORDERS Anxiety TIA   GI/Hepatic Neg liver ROS, hiatal hernia, GERD  Medicated and Controlled,  Endo/Other  Hypothyroidism   Renal/GU negative Renal ROS  negative genitourinary   Musculoskeletal negative musculoskeletal ROS (+)   Abdominal   Peds  Hematology negative hematology ROS (+)   Anesthesia Other Findings   Reproductive/Obstetrics                            Anesthesia Physical Anesthesia Plan  ASA: III  Anesthesia Plan: General and Regional   Post-op Pain Management:  Regional for Post-op pain   Induction: Intravenous  PONV Risk Score and Plan: 3 and Dexamethasone, Ondansetron and Treatment may vary due to age or medical condition  Airway Management Planned: Oral ETT  Additional Equipment:   Intra-op Plan:   Post-operative Plan: Extubation in OR  Informed Consent: I have reviewed the patients History and Physical, chart, labs and discussed the procedure including the risks, benefits and alternatives for the proposed anesthesia with the patient or authorized representative who has indicated his/her understanding and acceptance.     Dental advisory given  Plan Discussed with: CRNA  Anesthesia Plan Comments:         Anesthesia Quick  Evaluation

## 2021-03-20 NOTE — Anesthesia Postprocedure Evaluation (Signed)
Anesthesia Post Note  Patient: Melissa Herring  Procedure(s) Performed: OPEN REDUCTION INTERNAL FIXATION (ORIF) PROXIMAL HUMERUS FRACTURE (Right )     Patient location during evaluation: PACU Anesthesia Type: Regional Level of consciousness: awake and alert Pain management: pain level controlled Vital Signs Assessment: post-procedure vital signs reviewed and stable Respiratory status: spontaneous breathing, nonlabored ventilation and respiratory function stable Cardiovascular status: blood pressure returned to baseline and stable Postop Assessment: no apparent nausea or vomiting Anesthetic complications: no   No complications documented.  Last Vitals:  Vitals:   03/20/21 1547 03/20/21 1844  BP: (!) 164/91 (!) 174/95  Pulse:  86  Resp: (!) 28 17  Temp:  36.5 C  SpO2:  100%    Last Pain:  Vitals:   03/20/21 1547  TempSrc:   PainSc: 0-No pain                 Lowella Curb

## 2021-03-20 NOTE — Anesthesia Procedure Notes (Addendum)
Anesthesia Regional Block: Interscalene brachial plexus block   Pre-Anesthetic Checklist: ,, timeout performed, Correct Patient, Correct Site, Correct Laterality, Correct Procedure, Correct Position, site marked, Risks and benefits discussed,  Surgical consent,  Pre-op evaluation,  At surgeon's request and post-op pain management  Laterality: Right  Prep: Maximum Sterile Barrier Precautions used, chloraprep       Needles:  Injection technique: Single-shot  Needle Type: Echogenic Stimulator Needle     Needle Length: 4cm  Needle Gauge: 22     Additional Needles:   Procedures:,,,, ultrasound used (permanent image in chart),,,,  Narrative:  Start time: 03/20/2021 3:25 PM End time: 03/20/2021 3:35 PM Injection made incrementally with aspirations every 5 mL.  Performed by: Personally  Anesthesiologist: Elmer Picker, MD  Additional Notes: Monitors applied. No increased pain on injection. No increased resistance to injection. Injection made in 5cc increments. Good needle visualization. Patient tolerated procedure well.

## 2021-03-20 NOTE — Progress Notes (Signed)
Assisted Dr. Chelsey Woodrum with right, ultrasound guided, interscalene  block. Side rails up, monitors on throughout procedure. See vital signs in flow sheet. Tolerated Procedure well.  

## 2021-03-20 NOTE — Discharge Instructions (Signed)
 Kevin M. Supple, M.D., F.A.A.O.S. Orthopaedic Surgery Specializing in Arthroscopic and Reconstructive Surgery of the Shoulder [phone] [address] - [place], [place] [address] - Fax [phone]   POST-OP TOTAL SHOULDER REPLACEMENT INSTRUCTIONS  1. Follow up in the office for your first post-op appointment 10-14 days from the date of your surgery. If you do not already have a scheduled appointment, our office will contact you to schedule.  2. The bandage over your incision is waterproof. You may begin showering with this dressing on. You may leave this dressing on until first follow up appointment within 2 weeks. We prefer you leave this dressing in place until follow up however after 5-7 days if you are having itching or skin irritation and would like to remove it you may do so. Go slow and tug at the borders gently to break the bond the dressing has with the skin. At this point if there is no drainage it is okay to go without a bandage or you may cover it with a light guaze and tape. You can also expect significant bruising around your shoulder that will drift down your arm and into your chest wall. This is very normal and should resolve over several days.   3. Wear your sling/immobilizer at all times except to perform the exercises below or to occasionally let your arm dangle by your side to stretch your elbow. You also need to sleep in your sling immobilizer until instructed otherwise. It is ok to remove your sling if you are sitting in a controlled environment and allow your arm to rest in a position of comfort by your side or on your lap with pillows to give your neck and skin a break from the sling. You may remove it to allow arm to dangle by side to shower. If you are up walking around and when you go to sleep at night you need to wear it.  4. Range of motion to your elbow, wrist, and hand are encouraged 3-5 times daily. Exercise to your hand and fingers helps to reduce  swelling you may experience.   5. Prescriptions for a pain medication and a muscle relaxant are provided for you. It is recommended that if you are experiencing pain that you pain medication alone is not controlling, add the muscle relaxant along with the pain medication which can give additional pain relief. The first 1-2 days is generally the most severe of your pain and then should gradually decrease. As your pain lessens it is recommended that you decrease your use of the pain medications to an "as needed basis'" only and to always comply with the recommended dosages of the pain medications.  6. Pain medications can produce constipation along with their use. If you experience this, the use of an over the counter stool softener or laxative daily is recommended.   7. For additional questions or concerns, please do not hesitate to call the office. If after hours there is an answering service to forward your concerns to the physician on call.  8.Pain control following an exparel block  To help control your post-operative pain you received a nerve block  performed with Exparel which is a long acting anesthetic (numbing agent) which can provide pain relief and sensations of numbness (and relief of pain) in the operative shoulder and arm for up to 3 days. Sometimes it provides mixed relief, meaning you may still have numbness in certain areas of the arm but can still be able to   move  parts of that arm, hand, and fingers. We recommend that your prescribed pain medications  be used as needed. We do not feel it is necessary to "pre medicate" and "stay ahead" of pain.  Taking narcotic pain medications when you are not having any pain can lead to unnecessary and potentially dangerous side effects.    9. Use the ice machine as much as possible in the first 5-7 days from surgery, then you can wean its use to as needed. The ice typically needs to be replaced every 6 hours, instead of ice you can actually freeze  water bottles to put in the cooler and then fill water around them to avoid having to purchase ice. You can have spare water bottles freezing to allow you to rotate them once they have melted. Try to have a thin shirt or light cloth or towel under the ice wrap to protect your skin.   FOR ADDITIONAL INFO ON ICE MACHINE AND INSTRUCTIONS GO TO THE WEBSITE AT  [email or web address]  10.  We recommend that you avoid any dental work or cleaning in the first 3 months following your joint replacement. This is to help minimize the possibility of infection from the bacteria in your mouth that enters your bloodstream during dental work. We also recommend that you take an antibiotic prior to your dental work for the first year after your shoulder replacement to further help reduce that risk. Please simply contact our office for antibiotics to be sent to your pharmacy prior to dental work.  11. Dental Antibiotics:  In most cases prophylactic antibiotics for Dental procdeures after total joint surgery are not necessary.  Exceptions are as follows:  1. History of prior total joint infection  2. Severely immunocompromised (Organ Transplant, cancer chemotherapy, Rheumatoid biologic meds such as Humera)  3. Poorly controlled diabetes (A1C &gt; 8.0, blood glucose over 200)  If you have one of these conditions, contact your surgeon for an antibiotic prescription, prior to your dental procedure.   POST-OP EXERCISES  OK to allow arm to dangle and move elbow wrist and hand

## 2021-03-20 NOTE — Op Note (Signed)
03/20/2021  6:11 PM  PATIENT:   Melissa Herring  87 y.o. female  PRE-OPERATIVE DIAGNOSIS:  Displaced right 2 part proximal humerus fracture  POST-OPERATIVE DIAGNOSIS: Same  PROCEDURE: Open reduction and internal fixation of severely displaced right 2 part proximal humerus fracture with application of allograft bone graft  SURGEON:  Rennis Chris Vania Rea M.D.  ASSISTANTS: Ralene Bathe, PA-C  ANESTHESIA:   General endotracheal and interscalene block  EBL: 150 cc  SPECIMEN: None  Drains: None   PATIENT DISPOSITION:  PACU - hemodynamically stable.    PLAN OF CARE: Admit for overnight observation  Brief history:  Melissa Herring is an 87 year old female who had a ground level mechanical fall sustaining a severely displaced right 2 part proximal humerus fracture.  Due to the severe displacement she is brought to the operating this time for planned open reduction and internal fixation with bone grafting.  Preoperatively she was counseled regarding treatment options as well as the potential risks versus benefits thereof.  Possible surgical complications were reviewed including bleeding, infection, neurovascular injury, malunion, nonunion, loss of fixation, anesthetic complication, and possible need for additional surgery.  She understands, and accepts, and agrees to the plan procedure.  Procedure detail:  After undergoing routine preop evaluation the patient received prophylactic antibiotics and interscalene block with Exparel was established in the holding area by the anesthesia department.  Patient was subsequently placed supine on the operating table and underwent the smooth induction of a general endotracheal anesthesia.  Placed into the beachchair position and appropriately padded protected.  Fluoroscopic imaging was then obtained to confirm proper visualization of the shoulder fracture.  At this point the right shoulder girdle region was sterilely prepped and draped in standard fashion.   Timeout was called.  A deltopectoral approach to the right shoulder is made through an 8 cm incision.  Skin flaps were elevated dissection carried deeply and the deltopectoral interval was developed from proximal to distal with the vein taken laterally.  Dissection deep bili distally allowed elevation of the anterior portion of the deltoid insertion for plate application.  The humeral shaft had severely displaced medially and this was carefully mobilized and distracted and reduced beneath the humeral head.  To assist with the reduction we placed a series of grasping sutures through the bone tendon injection of the subscap Ilaris supraspinatus and infraspinatus.  This helped mobilize the head and controls rotation and ultimately we are able to relocate the humeral head over the humeral shaft.  There was some moderate comminution.  Fluoroscopic imaging was then used to confirm proper reduction.  We initially used the 95 degree Arthrex plate for proximal humerus fractures but unfortunate this construct did not provide optimum screw fixation within humeral head.  After an initial application we remove the 95 degree plate and switch to the 130 five 3-hole plate and this was then repositioned and used fluoroscopic imaging to confirm that overall the alignment was to our satisfaction.  The plate was provisionally placed with a clamp around the humeral shaft and then guidepin directed up into the humeral head.  Once we were pleased with the overall position and alignment the plate was fixed distally with a cortical screw and then the proximal locking screws were all then placed in fixation then completed distally.  At this point fluoroscopic imaging was then used and using live fluoroscopy we confirmed the hardware was in proper position and to the screws were ultimately adjusted to a somewhat shorter length and ultimately very pleased with  overall alignment and position the hardware.  At this point we then obtained 10 cc  of stay graft demineralized bone matrix to fill the metaphyseal defect.  After the application of the bone graft final irrigation was completed.  Hemostasis was obtained.  The sutures previously placed through the bone tendon injection of the rotator cuff were then passed through the eyelets at the superior margins of our plate to assist with the overall reduction and stability of the repair.  Once these were tied then the deltopectoral interval was reapproximated with a series of figure-of-eight #1 Vicryl sutures.  2-0 Monocryl used for subcu layer and intracuticular 3-0 Monocryl for the skin followed by Dermabond and Aquacel dressing.  The right arm was then placed into a sling.  The patient was then awakened, extubated, and taken to the recovery room in stable condition.  Ralene Bathe, PA-C was utilized as an Geophysicist/field seismologist throughout this case, essential for help with positioning the patient, positioning extremity, tissue manipulation, implantation of the prosthesis, suture management, wound closure, and intraoperative decision-making.  Senaida Lange MD   Contact # [phone]

## 2021-03-21 ENCOUNTER — Other Ambulatory Visit: Payer: Self-pay

## 2021-03-21 DIAGNOSIS — S42221A 2-part displaced fracture of surgical neck of right humerus, initial encounter for closed fracture: Secondary | ICD-10-CM | POA: Diagnosis not present

## 2021-03-21 MED ORDER — PSEUDOEPHEDRINE HCL 30 MG PO TABS
30.0000 mg | ORAL_TABLET | Freq: Four times a day (QID) | ORAL | Status: DC | PRN
  Administered 2021-03-21: 30 mg via ORAL
  Filled 2021-03-21 (×3): qty 1

## 2021-03-21 NOTE — Progress Notes (Signed)
Melissa Herring  MRN: [id number] DOB/Age: 1930/11/25 87 y.o. [facility] Procedure: Procedure(s) (LRB): OPEN REDUCTION INTERNAL FIXATION (ORIF) PROXIMAL HUMERUS FRACTURE (Right)     Subjective: Sitting up, grandson present. C/O ringing in her ears but relatively comfortable. Also with c/o pain and weakness to LUE in addition to her R shoulder fx making any use of upper extremity difficult  Family and pt interested in "rehab" stay  Vital Signs Temp:  [97.6 F (36.4 C)-98.3 F (36.8 C)] 97.6 F (36.4 C) (05/04 0944) Pulse Rate:  [79-103] 94 (05/04 0944) Resp:  [9-31] 16 (05/04 0944) BP: (137-182)/(63-116) 142/63 (05/04 0944) SpO2:  [94 %-100 %] 94 % (05/04 0944)  Lab Results Recent Labs    03/20/21 1320  WBC 7.9  HGB 11.6*  HCT 37.5  PLT 378   BMET Recent Labs    03/20/21 1320  NA 139  K 4.7  CL 109  CO2 22  GLUCOSE 97  BUN 9  CREATININE 0.56  CALCIUM 9.9   INR  Date Value Ref Range Status  06/08/2018 1.22  Final    Comment:    Performed at [facility], [address], [place], [place] [address]     Exam Right surgical dressing dry She is NVI distally        Plan We discussed disposition options and Lucy with case management also present. We have discussed that [facility] does not have rehab as option and any inpatient stay will be short term in a skilled facility. Valentina Gu will submit to see if she is a candidate. If not plan will be to DC home with family tomorrow and home health agency that has already been arranged preoperatively through [facility].  French Ana Dameer Speiser PA-C  03/21/2021, 12:17 PM Contact # [phone]

## 2021-03-21 NOTE — Evaluation (Signed)
Physical Therapy Evaluation Patient Details Name: Melissa Herring MRN: [id number] DOB: [birth year] Today's Date: 03/21/2021   History of Present Illness  87 year old woman s/p ORIF of R proximal humeral fracture.  Clinical Impression  Patient is s/p above surgery resulting in functional limitations due to the deficits listed below (see PT Problem List).  Patient will benefit from skilled PT to increase their independence and safety with mobility to allow discharge to the venue listed below.  Pt assisted with ambulating in hallway however very unsteady and requiring mod assist at times to prevent falls.  Pt reports she started using SPC just before being admitted and was not using any assistive device when she fell prior to admission (she believes she "blacked out").  Pt does endorse poor balance at balance and very agreeable to post acute rehab prior to d/c home as she lives alone.     Follow Up Recommendations SNF    Equipment Recommendations  None recommended by PT    Recommendations for Other Services       Precautions / Restrictions Precautions Precautions: Shoulder Type of Shoulder Precautions: No AROM, No PROM, Okay for active ROM o elbow, wrist and hand. May allow arm to dangle. Can shower. Shoulder Interventions: Shoulder sling/immobilizer;At all times;Off for dressing/bathing/exercises Precaution Booklet Issued:  (handouts) Required Braces or Orthoses: Sling Restrictions Weight Bearing Restrictions: Yes RUE Weight Bearing: Non weight bearing      Mobility  Bed Mobility Overal bed mobility: Needs Assistance Bed Mobility: Supine to Sit     Supine to sit: Min assist;HOB elevated     General bed mobility comments: pt in recliner    Transfers Overall transfer level: Needs assistance Equipment used: 1 person hand held assist Transfers: Sit to/from Stand Sit to Stand: Min assist         General transfer comment: assist to rise and steady, reliant on Lt UE to  assist  Ambulation/Gait Ambulation/Gait assistance: Min assist;Mod assist Gait Distance (Feet): 100 Feet Assistive device: Straight cane Gait Pattern/deviations: Step-through pattern;Decreased stride length;Narrow base of support Gait velocity: decr   General Gait Details: pt with short unsteady steps,  initially provided HHA for 50 feet and then provided [facility] for return to room, pt with occassional LOB requiring mod assist to prevent fall  Stairs            Wheelchair Mobility    Modified Rankin (Stroke Patients Only)       Balance Overall balance assessment: History of Falls Sitting-balance support: No upper extremity supported Sitting balance-Leahy Scale: Fair     Standing balance support: Single extremity supported Standing balance-Leahy Scale: Poor Standing balance comment: reliant on UE support                             Pertinent Vitals/Pain Pain Assessment: Faces Faces Pain Scale: Hurts little more Pain Location: R shoulder Pain Descriptors / Indicators: Grimacing;Discomfort;Aching;Guarding Pain Intervention(s): Repositioned;Monitored during session;Ice applied    Home Living Family/patient expects to be discharged to:: Private residence Living Arrangements: Alone Available Help at Discharge: Family;Available PRN/intermittently Type of Home: House Home Access: Ramped entrance     Home Layout: One level Home Equipment: Walker - 2 wheels;Cane - single point;Transport chair      Prior Function Level of Independence: Independent         Comments: Has DME but didn't need it.     Hand Dominance   Dominant Hand: Right  Extremity/Trunk Assessment   Upper Extremity Assessment Upper Extremity Assessment: RUE deficits/detail;LUE deficits/detail RUE Deficits / Details: Decreased AROM of UE secondary to continued effects of block - partial movement of elbow, wrist, forearm and fingers. RUE Sensation: decreased light touch LUE Deficits  / Details: Decreased shoulder ROM (unable to reach back of head), 3-/5 strength in shoulder - reports pain, weakness and decreased ROM on left side for 3-4 weeks prior to right humeral fracture. Was going to be seeing Ortho for left shoulder. LUE Sensation: WNL LUE Coordination: WNL    Lower Extremity Assessment Lower Extremity Assessment: Generalized weakness    Cervical / Trunk Assessment Cervical / Trunk Assessment: Normal  Communication   Communication: No difficulties  Cognition Arousal/Alertness: Awake/alert Behavior During Therapy: WFL for tasks assessed/performed Overall Cognitive Status: Within Functional Limits for tasks assessed                                        General Comments      Exercises     Assessment/Plan    PT Assessment Patient needs continued PT services  PT Problem List Decreased strength;Decreased mobility;Decreased balance;Decreased knowledge of use of DME;Decreased activity tolerance       PT Treatment Interventions Gait training;Therapeutic exercise;DME instruction;Therapeutic activities;Functional mobility training;Patient/family education;Balance training    PT Goals (Current goals can be found in the Care Plan section)  Acute Rehab PT Goals Patient Stated Goal: return to independence PT Goal Formulation: With patient Time For Goal Achievement: 04/04/21 Potential to Achieve Goals: Good    Frequency Min 3X/week   Barriers to discharge        Co-evaluation               AM-PAC PT "6 Clicks" Mobility  Outcome Measure Help needed turning from your back to your side while in a flat bed without using bedrails?: A Lot Help needed moving from lying on your back to sitting on the side of a flat bed without using bedrails?: A Lot Help needed moving to and from a bed to a chair (including a wheelchair)?: A Lot Help needed standing up from a chair using your arms (e.g., wheelchair or bedside chair)?: A Lot Help needed  to walk in hospital room?: A Lot Help needed climbing 3-5 steps with a railing? : A Lot 6 Click Score: 12    End of Session Equipment Utilized During Treatment: Gait belt Activity Tolerance: Patient tolerated treatment well Patient left: in chair;with call bell/phone within reach;with chair alarm set Nurse Communication: Mobility status PT Visit Diagnosis: Other abnormalities of gait and mobility (R26.89);Unsteadiness on feet (R26.81)    Time: 7209-4709 PT Time Calculation (min) (ACUTE ONLY): 26 min   Charges:   PT Evaluation $PT Eval Low Complexity: 1 Low PT Treatments $Gait Training: 8-22 mins       Thomasene Mohair PT, DPT Acute Rehabilitation Services Pager: [phone] Office: [phone]  Chudney Scheffler,KATHrine E 03/21/2021, 11:42 AM

## 2021-03-21 NOTE — TOC Initial Note (Signed)
Transition of Care [facility]) - Initial/Assessment Note    Patient Details  Name: Melissa Herring MRN: [id number] Date of Birth: 10-Mar-1931  [facility]) CM/SW Contact:    Lennart Pall, LCSW Phone Number: 03/21/2021, 2:58 PM  Clinical Narrative:                 Met with pt and grandson, Tyson Dense [phone]) today to discuss dc plans.  Pt very limited with mobility and concerns about dc directly home where she lives alone.  Therapies have recommended SNF.  Explain to both that I will initiate insurance auth, however, they need to determine a back up plan if this is denied.  They understand and are agreeable.  Have begun bed search and ins auth with ref# [id number].  Expected Discharge Plan: Skilled Nursing Facility Barriers to Discharge: Insurance Authorization   Patient Goals and CMS Choice Patient states their goals for this hospitalization and ongoing recovery are:: to eventually return home   Choice offered to / list presented to : [place]  Expected Discharge Plan and Services Expected Discharge Plan: [place] In-house Referral: Clinical Social Work   Post Acute Care Choice: [place] Living arrangements for the past 2 months: [place]                 DME Arranged: N/A DME Agency: NA                  Prior Living Arrangements/Services Living arrangements for the past 2 months: Single Family Home Lives with:: Self Patient language and need for interpreter reviewed:: Yes Do you feel safe going back to the place where you live?: Yes      Need for Family Participation in Patient Care: Yes (Comment) Care giver support system in place?: No (comment)   Criminal Activity/Legal Involvement Pertinent to Current Situation/Hospitalization: No - Comment as needed  Activities of Daily Living Home Assistive Devices/Equipment: None ADL Screening (condition at time of admission) Patient's cognitive ability adequate  to safely complete daily activities?: Yes Is the patient deaf or have difficulty hearing?: No Does the patient have difficulty seeing, even when wearing glasses/contacts?: No Does the patient have difficulty concentrating, remembering, or making decisions?: No Patient able to express need for assistance with ADLs?: Yes Does the patient have difficulty dressing or bathing?: Yes Independently performs ADLs?: Yes (appropriate for developmental age) Does the patient have difficulty walking or climbing stairs?: No Weakness of Legs: None Weakness of Arms/Hands: Right  Permission Sought/Granted Permission sought to share information with : Family Supports Permission granted to share information with : Yes, Verbal Permission Granted  Share Information with NAME: son, Clarabel Marion @ [phone]     Permission granted to share info w Relationship: son  Permission granted to share info w Contact Information: [phone]  Emotional Assessment Appearance:: Appears stated age Attitude/Demeanor/Rapport: Gracious,Engaged Affect (typically observed): Accepting Orientation: : Oriented to Self,Oriented to Place,Oriented to  Time,Oriented to Situation Alcohol / Substance Use: Not Applicable Psych Involvement: No (comment)  Admission diagnosis:  Proximal humerus fracture [S42.209A] Patient Active Problem List   Diagnosis Date Noted  . Proximal humerus fracture 03/20/2021  . Spinal stenosis, lumbar region with neurogenic claudication 06/12/2018  . Multiple pulmonary nodules determined by computed tomography of lung 05/27/2018  . Abdominal pain, left lower quadrant 05/12/2012  . Dysphagia, unspecified(787.20) 02/12/2012  . Family history of malignant neoplasm of gastrointestinal tract 02/12/2012  . DIARRHEA OF PRESUMED INFECTIOUS ORIGIN 03/03/2009  .  RECTAL BLEEDING 03/03/2009  . ABDOMINAL PAIN -GENERALIZED 03/03/2009  . DIARRHEA 01/04/2009  . CHANGE IN BOWELS 12/21/2008  . HYPERLIPIDEMIA  12/16/2008  . ANXIETY 12/16/2008  . Essential hypertension 12/16/2008  . DIVERTICULOSIS OF COLON 12/16/2008  . OSTEOPOROSIS 12/16/2008  . ACID REFLUX DISEASE 10/28/2008  . DYSPNEA 03/25/2008  . PNEUMONIA 03/01/2008  . OTHER ACUTE SINUSITIS 02/29/2008  . COUGH, CHRONIC 02/29/2008   PCP:  Raina Mina., MD Pharmacy:   [facility] [address] [place] [address] [place] [address] Phone: [phone] Fax: [phone]     Social Determinants of Health (SDOH) Interventions    Readmission Risk Interventions No flowsheet data found.

## 2021-03-21 NOTE — NC FL2 (Signed)
[place] MEDICAID FL2 LEVEL OF CARE SCREENING TOOL     IDENTIFICATION  Patient Name: Melissa Herring Birthdate: 1931/06/18 Sex: female Admission Date (Current Location): 03/20/2021  [facility] and [place] Number:  Producer, television/film/video and Address:  [facility],  [address] [place] [address]      Provider Number:    Attending Physician Name and Address:  Francena Hanly, MD  Relative Name and Phone Number:  son, Morgana Rowley @ [phone]    Current Level of Care: Hospital Recommended Level of Care: Skilled Nursing Facility Prior Approval Number:    Date Approved/Denied:   PASRR Number: [id number]  Discharge Plan: SNF    Current Diagnoses: Patient Active Problem List   Diagnosis Date Noted  . Proximal humerus fracture 03/20/2021  . Spinal stenosis, lumbar region with neurogenic claudication 06/12/2018  . Multiple pulmonary nodules determined by computed tomography of lung 05/27/2018  . Abdominal pain, left lower quadrant 05/12/2012  . Dysphagia, unspecified(787.20) 02/12/2012  . Family history of malignant neoplasm of gastrointestinal tract 02/12/2012  . DIARRHEA OF PRESUMED INFECTIOUS ORIGIN 03/03/2009  . RECTAL BLEEDING 03/03/2009  . ABDOMINAL PAIN -GENERALIZED 03/03/2009  . DIARRHEA 01/04/2009  . CHANGE IN BOWELS 12/21/2008  . HYPERLIPIDEMIA 12/16/2008  . ANXIETY 12/16/2008  . Essential hypertension 12/16/2008  . DIVERTICULOSIS OF COLON 12/16/2008  . OSTEOPOROSIS 12/16/2008  . ACID REFLUX DISEASE 10/28/2008  . DYSPNEA 03/25/2008  . PNEUMONIA 03/01/2008  . OTHER ACUTE SINUSITIS 02/29/2008  . COUGH, CHRONIC 02/29/2008    Orientation RESPIRATION BLADDER Height & Weight     Self,Time,Situation,Place  Normal Continent Weight: 132 lb (59.9 kg) Height:  5\' 2"  (157.5 cm)  BEHAVIORAL SYMPTOMS/MOOD NEUROLOGICAL BOWEL NUTRITION STATUS      Continent Diet (Regular)  AMBULATORY STATUS COMMUNICATION OF NEEDS Skin   Limited Assist Verbally Surgical  wounds                       Personal Care Assistance Level of Assistance  Bathing,Dressing Bathing Assistance: Limited assistance   Dressing Assistance: Limited assistance     Functional Limitations Info             SPECIAL CARE FACTORS FREQUENCY  PT (By licensed PT),OT (By licensed OT)     PT Frequency: 5x/wk OT Frequency: 5x/wk            Contractures Contractures Info: Not present    Additional Factors Info  Code Status,Allergies Code Status Info: Full Allergies Info: see MAR           Current Medications (03/21/2021):  This is the current hospital active medication list Current Facility-Administered Medications  Medication Dose Route Frequency Provider Last Rate Last Admin  . acetaminophen (TYLENOL) tablet 325-650 mg  325-650 mg Oral Q6H PRN Shuford, 05/21/2021, PA-C   650 mg at 03/21/21 05/21/21  . albuterol (VENTOLIN HFA) 108 (90 Base) MCG/ACT inhaler 2 puff  2 puff Inhalation Q6H PRN Shuford, Tracy, PA-C      . bisacodyl (DULCOLAX) EC tablet 5 mg  5 mg Oral Daily PRN Shuford, 4235, PA-C      . [START ON 03/26/2021] cloNIDine (CATAPRES - Dosed in mg/24 hr) patch 0.1 mg  0.1 mg Transdermal Weekly Shuford, Tracy, PA-C   0.1 mg at 03/20/21 1929  . diphenhydrAMINE (BENADRYL) 12.5 MG/5ML elixir 12.5-25 mg  12.5-25 mg Oral Q4H PRN Shuford, Tracy, PA-C      . docusate sodium (COLACE) capsule 100 mg  100 mg Oral BID Shuford,  French Ana, PA-C   100 mg at 03/21/21 0916  . gabapentin (NEURONTIN) capsule 300 mg  300 mg Oral TID PRN Shuford, Tracy, PA-C      . hydrochlorothiazide (HYDRODIURIL) tablet 25 mg  25 mg Oral Daily Shuford, Tracy, PA-C   25 mg at 03/21/21 0916  . HYDROmorphone (DILAUDID) injection 0.5-1 mg  0.5-1 mg Intravenous Q4H PRN Shuford, Tracy, PA-C      . lactated ringers infusion   Intravenous Continuous Shuford, French Ana, PA-C 75 mL/hr at 03/20/21 1953 New Bag at 03/20/21 1953  . losartan (COZAAR) tablet 50 mg  50 mg Oral Daily Shuford, Tracy, PA-C   50 mg at 03/21/21  0916  . magnesium citrate solution 1 Bottle  1 Bottle Oral Once PRN Shuford, French Ana, PA-C      . menthol-cetylpyridinium (CEPACOL) lozenge 3 mg  1 lozenge Oral PRN Shuford, French Ana, PA-C       Or  . phenol (CHLORASEPTIC) mouth spray 1 spray  1 spray Mouth/Throat PRN Shuford, Tracy, PA-C      . methocarbamol (ROBAXIN) tablet 500 mg  500 mg Oral Q6H PRN Shuford, Tracy, PA-C       Or  . methocarbamol (ROBAXIN) 500 mg in dextrose 5 % 50 mL IVPB  500 mg Intravenous Q6H PRN Shuford, Tracy, PA-C 100 mL/hr at 03/20/21 1859 500 mg at 03/20/21 1859  . metoCLOPramide (REGLAN) tablet 5-10 mg  5-10 mg Oral Q8H PRN Shuford, Tracy, PA-C       Or  . metoCLOPramide (REGLAN) injection 5-10 mg  5-10 mg Intravenous Q8H PRN Shuford, Tracy, PA-C      . ondansetron (ZOFRAN) tablet 4 mg  4 mg Oral Q6H PRN Shuford, Tracy, PA-C       Or  . ondansetron (ZOFRAN) injection 4 mg  4 mg Intravenous Q6H PRN Shuford, Tracy, PA-C      . oxyCODONE (Oxy IR/ROXICODONE) immediate release tablet 10 mg  10 mg Oral Q4H PRN Shuford, Tracy, PA-C      . oxyCODONE (Oxy IR/ROXICODONE) immediate release tablet 5 mg  5 mg Oral Q4H PRN Shuford, Tracy, PA-C      . pantoprazole (PROTONIX) EC tablet 40 mg  40 mg Oral Daily Shuford, Tracy, PA-C   40 mg at 03/21/21 0916  . polyethylene glycol (MIRALAX / GLYCOLAX) packet 17 g  17 g Oral Daily PRN Shuford, French Ana, PA-C         Discharge Medications: Please see discharge summary for a list of discharge medications.  Relevant Imaging Results:  Relevant Lab Results:   Additional Information SS# [id number]  Amada Jupiter, LCSW

## 2021-03-21 NOTE — Evaluation (Signed)
Occupational Therapy Evaluation Patient Details Name: Melissa Herring MRN: [id number] DOB: Jan 27, 1931 Today's Date: 03/21/2021    History of Present Illness 87 year old woman s/p ORIF of R proximal humeral fracture.   Clinical Impression   Melissa Herring is an 87 year old woman normally independent and lives alone who sustained proximal humeral fracture of right dominant upper extremity on March 07, 2021. Prior to fall patient reports increasing difficulty with left shoulder for 3-4 weeks reporting decreased ROM and strength and that she was getting ready to see an orthopedic surgeon in regards too. Since fracture patient has been predominantly seated in recliner with a Depends. Only getting up when family is able to come by and assist her to the bathroom - sometimes as late as 6 pm. She reports she hadn't been eating as much as well because she had to wait until family was able to bring her some food. Today patient presents with non functional use of right dominant upper extremity, impaired use of left upper extremity, generalized weakness, decreased activity tolerance, impaired balance and shoulder pain. Patient provided with education in regards to weight bearing status and PROM restrictions and UE positioning guidelines. Patient will benefit from skilled OT services while in hospital to improve deficits and learn compensatory strategies as needed in order to return PLOF. Highly recommend short term rehab at discharge - as patient independent at baseline and has limited family support.    Follow Up Recommendations  SNF    Equipment Recommendations  3 in 1 bedside commode    Recommendations for Other Services       Precautions / Restrictions Precautions Precautions: Shoulder Type of Shoulder Precautions: No AROM, No PROM, Okay for active ROM o elbow, wrist and hand. May allow arm to dangle. Can shower. Shoulder Interventions: Shoulder sling/immobilizer;At all times;Off for  dressing/bathing/exercises Precaution Booklet Issued:  (handouts) Required Braces or Orthoses: Sling Restrictions Weight Bearing Restrictions: Yes RUE Weight Bearing: Non weight bearing      Mobility Bed Mobility Overal bed mobility: Needs Assistance Bed Mobility: Supine to Sit     Supine to sit: Min assist;HOB elevated     General bed mobility comments: Min assist for hand hold to pull up into seated position and scoot to edge of bed.    Transfers Overall transfer level: Needs assistance Equipment used: None Transfers: Sit to/from Stand Sit to Stand: Min assist         General transfer comment: MIn assist for all standing and taking steps.    Balance Overall balance assessment: Needs assistance Sitting-balance support: No upper extremity supported Sitting balance-Leahy Scale: Fair     Standing balance support: Single extremity supported Standing balance-Leahy Scale: Poor                             ADL either performed or assessed with clinical judgement   ADL Overall ADL's : Needs assistance/impaired Eating/Feeding: Set up;Sitting Eating/Feeding Details (indicate cue type and reason): set up for breakfast tray Grooming: Set up;Sitting   Upper Body Bathing: Minimal assistance;Sitting   Lower Body Bathing: Moderate assistance;Sit to/from stand   Upper Body Dressing : Maximal assistance;Sitting;Adhering to UE precautions   Lower Body Dressing: Maximal assistance;Sit to/from stand   Toilet Transfer: Minimal assistance;Stand-pivot;BSC   Toileting- Clothing Manipulation and Hygiene: Maximal assistance;Sit to/from stand Toileting - Clothing Manipulation Details (indicate cue type and reason): able to wipe periarea in seated position but needs assistance for  perianal due to limied shoulder ROM on left and needs assistance for clothing management as she has to hold onto therapist to stand.     Functional mobility during ADLs: Minimal assistance        Vision Patient Visual Report: No change from baseline       Perception     Praxis      Pertinent Vitals/Pain Pain Assessment: Faces Faces Pain Scale: Hurts little more Pain Location: R shoulder - with PROM Pain Descriptors / Indicators: Grimacing;Discomfort;Aching;Guarding Pain Intervention(s): Monitored during session;Premedicated before session     Hand Dominance Right   Extremity/Trunk Assessment Upper Extremity Assessment Upper Extremity Assessment: RUE deficits/detail;LUE deficits/detail RUE Deficits / Details: Decreased AROM of UE secondary to continued effects of block - partial movement of elbow, wrist, forearm and fingers. RUE Sensation: decreased light touch LUE Deficits / Details: Decreased shoulder ROM (unable to reach back of head), 3-/5 strength in shoulder - reports pain, weakness and decreased ROM on left side for 3-4 weeks prior to right humeral fracture. Was going to be seeing Ortho for left shoulder. LUE Sensation: WNL LUE Coordination: WNL   Lower Extremity Assessment Lower Extremity Assessment: Defer to PT evaluation   Cervical / Trunk Assessment Cervical / Trunk Assessment: Normal   Communication Communication Communication: No difficulties   Cognition Arousal/Alertness: Awake/alert Behavior During Therapy: WFL for tasks assessed/performed Overall Cognitive Status: Within Functional Limits for tasks assessed                                     General Comments       Exercises     Shoulder Instructions      Home Living Family/patient expects to be discharged to:: Private residence Living Arrangements: Alone Available Help at Discharge: Family;Available PRN/intermittently Type of Home: House Home Access: Ramped entrance     Home Layout: One level     Bathroom Shower/Tub: Producer, television/film/video: Handicapped height     Home Equipment: Environmental consultant - 2 wheels          Prior Functioning/Environment Level  of Independence: Independent        Comments: Has DME but didn't need it.        OT Problem List: Decreased strength;Decreased range of motion;Decreased activity tolerance;Impaired balance (sitting and/or standing);Decreased knowledge of use of DME or AE;Impaired UE functional use;Pain;Decreased knowledge of precautions      OT Treatment/Interventions: Self-care/ADL training;Therapeutic exercise;DME and/or AE instruction;Therapeutic activities;Patient/family education;Balance training    OT Goals(Current goals can be found in the care plan section) Acute Rehab OT Goals Patient Stated Goal: return to independence OT Goal Formulation: With patient Time For Goal Achievement: 04/04/21 Potential to Achieve Goals: Good  OT Frequency: Min 2X/week   Barriers to D/C: Decreased caregiver support          Co-evaluation              AM-PAC OT "6 Clicks" Daily Activity     Outcome Measure Help from another person eating meals?: A Little Help from another person taking care of personal grooming?: A Little Help from another person toileting, which includes using toliet, bedpan, or urinal?: A Lot Help from another person bathing (including washing, rinsing, drying)?: A Lot Help from another person to put on and taking off regular upper body clothing?: A Lot Help from another person to put on and taking off regular lower body clothing?:  A Lot 6 Click Score: 14   End of Session Nurse Communication: Mobility status;Weight bearing status  Activity Tolerance: Patient tolerated treatment well Patient left: in chair;with call bell/phone within reach;with chair alarm set  OT Visit Diagnosis: Unsteadiness on feet (R26.81);History of falling (Z91.81);Muscle weakness (generalized) (M62.81);Pain Pain - Right/Left: Right Pain - part of body: Shoulder                Time: 4492-0100 OT Time Calculation (min): 29 min Charges:  OT General Charges $OT Visit: 1 Visit OT Evaluation $OT Eval  Moderate Complexity: 1 Mod OT Treatments $Self Care/Home Management : 8-22 mins  Melissa Herring, OTR/L Acute Care Rehab Services  Office [phone] Pager: [phone]   Melissa Herring 03/21/2021, 9:05 AM

## 2021-03-22 DIAGNOSIS — S42221A 2-part displaced fracture of surgical neck of right humerus, initial encounter for closed fracture: Secondary | ICD-10-CM | POA: Diagnosis not present

## 2021-03-22 LAB — RESP PANEL BY RT-PCR (FLU A&B, COVID) ARPGX2
Influenza A by PCR: NEGATIVE
Influenza B by PCR: NEGATIVE
SARS Coronavirus 2 by RT PCR: NEGATIVE

## 2021-03-22 MED ORDER — OXYCODONE HCL 5 MG PO TABS: 5.0000 mg | ORAL_TABLET | ORAL | 0 refills | Status: AC | PRN

## 2021-03-22 NOTE — TOC Transition Note (Signed)
Transition of Care [facility]) - CM/SW Discharge Note   Patient Details  Name: SHINIKA ESTELLE MRN: [id number] Date of Birth: November 04, 1931  [facility] CM/SW Contact:  Amada Jupiter, LCSW Phone Number: 03/22/2021, 12:24 PM   Clinical Narrative:    Have received insurance auth [id number]) and pt/ family have accepted SNF bed at [facility] [place].  PTAR called at 12:15 (no delay anticipated).  RN to call report to [phone].  No further TOC needs.   Final next level of care: Skilled Nursing Facility Barriers to Discharge: No Barriers Identified   Patient Goals and CMS Choice Patient states their goals for this hospitalization and ongoing recovery are:: to eventually return home   Choice offered to / list presented to : Patient,Adult Children  Discharge Placement PASRR number recieved: 03/21/21            Patient chooses bed at: [facility] Patient to be transferred to facility by: PTAR Name of family member notified: son, Ermalee Mealy Patient and family notified of of transfer: 03/22/21  Discharge Plan and Services In-house Referral: Clinical Social Work   Post Acute Care Choice: Skilled Nursing Facility          DME Arranged: N/A DME Agency: NA                  Social Determinants of Health (SDOH) Interventions     Readmission Risk Interventions No flowsheet data found.

## 2021-03-22 NOTE — Progress Notes (Signed)
Occupational Therapy Treatment Patient Details Name: Melissa Herring MRN: [id number] DOB: [birth year] Today's Date: 03/22/2021    History of present illness 87 year old woman s/p ORIF of R proximal humeral fracture.   OT comments  Treatment focused on education and instruction in regards to weight bearing status, positioning, sling positioning, shoulder precautions and ROM parameters and HEP all in the context of getting out of bed and performing grooming task at sink. Patient positioned in recliner after grooming. Patient's son present during treatment and verbalizes understanding of education. Education needs to be reinforced as patient focused on deficits and pain. Continue to highly recommend short term rehab at discharge.   Follow Up Recommendations  SNF    Equipment Recommendations  3 in 1 bedside commode    Recommendations for Other Services      Precautions / Restrictions Precautions Precautions: Shoulder Type of Shoulder Precautions: No AROM, No PROM, Okay for active ROM o elbow, wrist and hand. May allow arm to dangle. Can shower. Shoulder Interventions: Shoulder sling/immobilizer;At all times;Off for dressing/bathing/exercises Precaution Booklet Issued:  (handouts) Required Braces or Orthoses: Sling Restrictions Weight Bearing Restrictions: Yes RUE Weight Bearing: Non weight bearing       Mobility Bed Mobility Overal bed mobility: Needs Assistance Bed Mobility: Supine to Sit     Supine to sit: Min assist     General bed mobility comments: Hand hold to pull up on to elevate trunk and scoot forward to edge of bed - patient able to pull with left arm.    Transfers Overall transfer level: Needs assistance Equipment used: 1 person hand held assist Transfers: Sit to/from Stand Sit to Stand: Min assist         General transfer comment: MIn assist to stand with blocking of feet. Hand hold to ambulate to bathroom.    Balance Overall balance assessment: Needs  assistance Sitting-balance support: No upper extremity supported Sitting balance-Leahy Scale: Fair     Standing balance support: Single extremity supported Standing balance-Leahy Scale: Poor                             ADL either performed or assessed with clinical judgement   ADL Overall ADL's : Needs assistance/impaired Eating/Feeding: Set up   Grooming: Min guard;Wash/dry face;Brushing hair;Moderate assistance;Standing Grooming Details (indicate cue type and reason): Patient min assist to ambulate to bathroom and stand at sink. Patient able to wash face but needed assistance to brush hair due to decreased shoulder ROM and pain in LUE as well.                                     Vision Patient Visual Report: No change from baseline     Perception     Praxis      Cognition Arousal/Alertness: Awake/alert Behavior During Therapy: WFL for tasks assessed/performed Overall Cognitive Status: Within Functional Limits for tasks assessed                                          Exercises     Shoulder Instructions       General Comments      Pertinent Vitals/ Pain       Pain Assessment: Faces Faces Pain Scale: Hurts whole lot Pain Location:  R shoulder Pain Descriptors / Indicators: Grimacing;Discomfort;Aching;Guarding Pain Intervention(s): Limited activity within patient's tolerance;RN gave pain meds during session;Monitored during session  Home Living                                          Prior Functioning/Environment              Frequency  Min 2X/week        Progress Toward Goals  OT Goals(current goals can now be found in the care plan section)  Progress towards OT goals: Progressing toward goals  Acute Rehab OT Goals Patient Stated Goal: return to independence OT Goal Formulation: With patient Time For Goal Achievement: 04/04/21 Potential to Achieve Goals: Good  Plan Discharge plan  remains appropriate    Co-evaluation                 AM-PAC OT "6 Clicks" Daily Activity     Outcome Measure   Help from another person eating meals?: A Little Help from another person taking care of personal grooming?: A Lot Help from another person toileting, which includes using toliet, bedpan, or urinal?: A Lot Help from another person bathing (including washing, rinsing, drying)?: A Lot Help from another person to put on and taking off regular upper body clothing?: A Lot Help from another person to put on and taking off regular lower body clothing?: A Lot 6 Click Score: 13    End of Session Equipment Utilized During Treatment: Gait belt  OT Visit Diagnosis: Unsteadiness on feet (R26.81);History of falling (Z91.81);Muscle weakness (generalized) (M62.81);Pain Pain - Right/Left: Right Pain - part of body: Shoulder   Activity Tolerance Patient tolerated treatment well   Patient Left in chair;with call bell/phone within reach;with chair alarm set;with family/visitor present   Nurse Communication Mobility status;Patient requests pain meds        Time: 2119-4174 OT Time Calculation (min): 46 min  Charges: OT General Charges $OT Visit: 1 Visit OT Treatments $Self Care/Home Management : 38-52 mins  Missy Baksh, OTR/L Acute Care Rehab Services  Office [phone] Pager: [phone]    Kelli Churn 03/22/2021, 10:02 AM

## 2021-03-22 NOTE — Discharge Summary (Signed)
PATIENT ID:      Melissa Herring  MRN:     [id number] DOB/AGE:    [birth year] / 87 y.o.     DISCHARGE SUMMARY  ADMISSION DATE:    03/20/2021 DISCHARGE DATE:    ADMISSION DIAGNOSIS: Displaced right 2 part proximal humerus fracture Past Medical History:  Diagnosis Date  . COPD (chronic obstructive pulmonary disease) (HCC)   . Diverticulitis   . GERD (gastroesophageal reflux disease)   . History of hiatal hernia   . History of hypothyroidism   . History of kidney stones   . Hypertension   . Mini stroke (HCC)    about 10 years ago per patient [facility]  . Pneumonia     DISCHARGE DIAGNOSIS:   Active Problems:   Proximal humerus fracture   PROCEDURE: Procedure(s): OPEN REDUCTION INTERNAL FIXATION (ORIF) PROXIMAL HUMERUS FRACTURE on 03/20/2021  CONSULTS:    HISTORY:  See H&P in chart.  HOSPITAL COURSE:  Melissa Herring is a 87 y.o. admitted on 03/20/2021 with a diagnosis of Displaced right 2 part proximal humerus fracture.  They were brought to the operating room on 03/20/2021 and underwent Procedure(s): OPEN REDUCTION INTERNAL FIXATION (ORIF) PROXIMAL HUMERUS FRACTURE.    They were given perioperative antibiotics:  Anti-infectives (From admission, onward)   Start     Dose/Rate Route Frequency Ordered Stop   03/21/21 0600  ceFAZolin (ANCEF) IVPB 2g/100 mL premix        2 g 200 mL/hr over 30 Minutes Intravenous On call to O.R. 03/20/21 1311 03/20/21 1612    .  Patient underwent the above named procedure and tolerated it well. The following day they were hemodynamically stable and pain was controlled on oral analgesics. They were neurovascularly intact to the operative extremity. OT/PT was ordered and worked with patient per protocol. It was felt that she would benefit from a skilled nursing facility stay.  They were medically and orthopaedically stable for discharge on 03/22/21.    DIAGNOSTIC STUDIES:  RECENT RADIOGRAPHIC STUDIES :  DG C-Arm 1-60 Min-No Report  Result Date:  03/20/2021 Fluoroscopy was utilized by the requesting physician.  No radiographic interpretation.    RECENT VITAL SIGNS:   Patient Vitals for the past 24 hrs:  BP Temp Temp src Pulse Resp SpO2  03/22/21 0526 117/76 98 F (36.7 C) Oral 93 16 92 %  03/21/21 2155 130/61 97.7 F (36.5 C) Oral 96 16 96 %  03/21/21 1427 (!) 113/53 98.1 F (36.7 C) Oral (!) 109 16 96 %  .  RECENT EKG RESULTS:    Orders placed or performed during the hospital encounter of 03/20/21  . EKG 12 lead per protocol  . EKG 12 lead per protocol    DISCHARGE INSTRUCTIONS:    DISCHARGE MEDICATIONS:   Allergies as of 03/22/2021      Reactions   Penicillins Hives   Has patient had a PCN reaction causing immediate rash, facial/tongue/throat swelling, SOB or lightheadedness with hypotension: No Has patient had a PCN reaction causing severe rash involving mucus membranes or skin necrosis: Yes Has patient had a PCN reaction that required hospitalization: Yes Has patient had a PCN reaction occurring within the last 10 years: No If all of the above answers are "NO", then may proceed with Cephalosporin use.   Sulfa Antibiotics Rash      Medication List    TAKE these medications   acetaminophen 500 MG tablet Commonly known as: TYLENOL Take 1,000 mg by mouth every 6 (six) hours  as needed for moderate pain or mild pain.   albuterol 108 (90 Base) MCG/ACT inhaler Commonly known as: VENTOLIN HFA Inhale 2 puffs into the lungs every 6 (six) hours as needed for wheezing or shortness of breath.   Calmoseptine 0.44-20.6 % Oint Generic drug: Menthol-Zinc Oxide Apply 1 application topically daily as needed (Sore Spots).   cholecalciferol 25 MCG (1000 UNIT) tablet Commonly known as: VITAMIN D3 Take 1,000 Units by mouth daily.   cloNIDine 0.1 mg/24hr patch Commonly known as: CATAPRES - Dosed in mg/24 hr Place 0.1 mg onto the skin once a week.   CoQ-10 400 MG Caps Take 200 mg by mouth daily.   esomeprazole 40 MG  capsule Commonly known as: NexIUM Take 30- 60 min before your first and last meals of the day What changed:   how much to take  how to take this  when to take this  reasons to take this   gabapentin 300 MG capsule Commonly known as: NEURONTIN Take 300 mg by mouth 3 (three) times daily as needed (Nerve pain).   hydrochlorothiazide 25 MG tablet Commonly known as: HYDRODIURIL Take 25 mg by mouth daily.   losartan 50 MG tablet Commonly known as: COZAAR Take 50 mg by mouth daily.   multivitamin-lutein Caps capsule Take 1 capsule by mouth daily.   oxyCODONE 5 MG immediate release tablet Commonly known as: Oxy IR/ROXICODONE Take 1-2 tablets (5-10 mg total) by mouth every 4 (four) hours as needed. What changed: reasons to take this   VISINE OP Place 1 drop into both eyes daily at 12 noon.   Vitamin A 2400 MCG (8000 UT) Caps Take 8,000 Units by mouth daily.   vitamin B-12 500 MCG tablet Commonly known as: CYANOCOBALAMIN Take 500 mcg by mouth daily.   vitamin C 500 MG tablet Commonly known as: ASCORBIC ACID Take 500 mg by mouth daily.   vitamin E 180 MG (400 UNITS) capsule Take 400 Units by mouth daily.       FOLLOW UP VISIT:    Contact information for follow-up providers    Francena Hanly, MD.   Specialty: Orthopedic Surgery Why: on 5/16 at 2:30 Contact information: [address] [place] [address] [place] [address] [phone]            Contact information for after-discharge care    Destination    [facility] Preferred SNF .   Service: Skilled Nursing Contact information: [address] [place] [address] [phone]                  DISCHARGE TO: Skilled   DISCHARGE CONDITION:  John Giovanni for Dr. Francena Hanly 03/22/2021, 10:56 AM

## 2021-03-27 ENCOUNTER — Encounter (HOSPITAL_COMMUNITY): Payer: Self-pay | Admitting: Orthopedic Surgery

## 2022-04-14 DIAGNOSIS — I361 Nonrheumatic tricuspid (valve) insufficiency: Secondary | ICD-10-CM | POA: Diagnosis not present
